# Patient Record
Sex: Male | Born: 1975 | Race: White | Hispanic: No | Marital: Married | Smoking: Current some day smoker
Health system: Southern US, Community
[De-identification: ages and names within clinical notes are randomized; demographics above are authoritative.]

## PROBLEM LIST (undated history)

## (undated) DIAGNOSIS — I10 Essential (primary) hypertension: Secondary | ICD-10-CM

## (undated) DIAGNOSIS — N200 Calculus of kidney: Secondary | ICD-10-CM

## (undated) DIAGNOSIS — Q613 Polycystic kidney, unspecified: Secondary | ICD-10-CM

## (undated) DIAGNOSIS — G8929 Other chronic pain: Secondary | ICD-10-CM

## (undated) DIAGNOSIS — N289 Disorder of kidney and ureter, unspecified: Secondary | ICD-10-CM

## (undated) DIAGNOSIS — M1990 Unspecified osteoarthritis, unspecified site: Secondary | ICD-10-CM

## (undated) HISTORY — DX: Other chronic pain: G89.29

## (undated) HISTORY — DX: Calculus of kidney: N20.0

## (undated) HISTORY — PX: LITHOTRIPSY: SUR834

---

## 1998-07-24 ENCOUNTER — Emergency Department (HOSPITAL_COMMUNITY): Admission: EM | Admit: 1998-07-24 | Discharge: 1998-07-24 | Payer: Self-pay | Admitting: *Deleted

## 1998-08-03 ENCOUNTER — Encounter: Admission: RE | Admit: 1998-08-03 | Discharge: 1998-08-03 | Payer: Self-pay | Admitting: *Deleted

## 2000-02-02 ENCOUNTER — Inpatient Hospital Stay (HOSPITAL_COMMUNITY): Admission: EM | Admit: 2000-02-02 | Discharge: 2000-02-09 | Payer: Self-pay | Admitting: Emergency Medicine

## 2001-04-18 ENCOUNTER — Emergency Department (HOSPITAL_COMMUNITY): Admission: EM | Admit: 2001-04-18 | Discharge: 2001-04-19 | Payer: Self-pay | Admitting: Emergency Medicine

## 2001-04-19 ENCOUNTER — Encounter: Payer: Self-pay | Admitting: Emergency Medicine

## 2001-04-19 IMAGING — CT CT PELVIS W/O CM
1 series · 15 of 32 positions shown, 19 images · non-contrast
Comparison: none

FINDINGS
CLINICAL DATA: LEFT FLANK PAIN.  HISTORY OF KIDNEY STONES.
CT ABDOMEN AND PELVIS WITHOUT CONTRAST
5 MM COLLIMATION NON-CONTRAST HELICAL CT IMAGES OF THE ABDOMEN AND PELVIS PERFORMED USING KIDNEY
STONE PROTOCOL.  NEITHER ORAL NOR INTRAVENOUS CONTRAST UTILIZED FOR THIS INDICATION.

[Series 2: renal stone · axial · 0.79mm/px · z∈[-511,-91]mm · 15 of 93 slices shown, 19 images]
[im 6/93  soft-tissue]
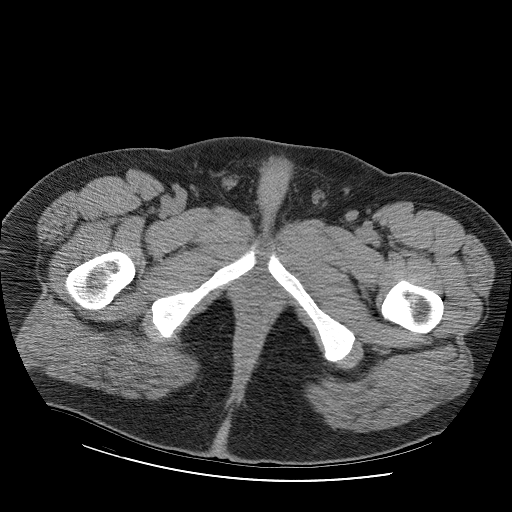
[im 6/93  bone]
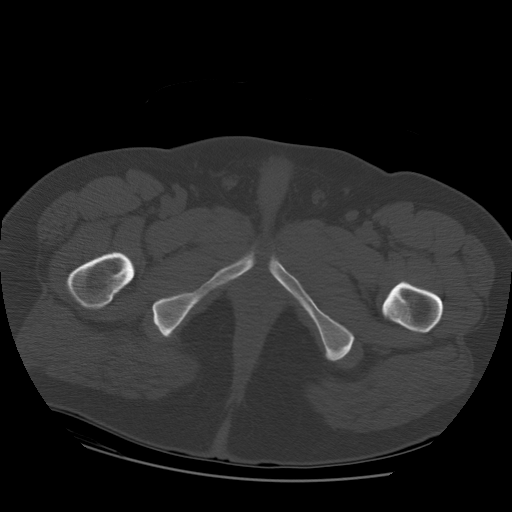
[im 12/93  soft-tissue]
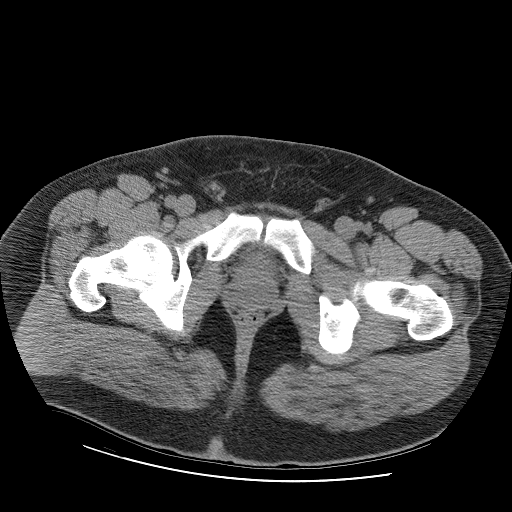
[im 18/93  soft-tissue]
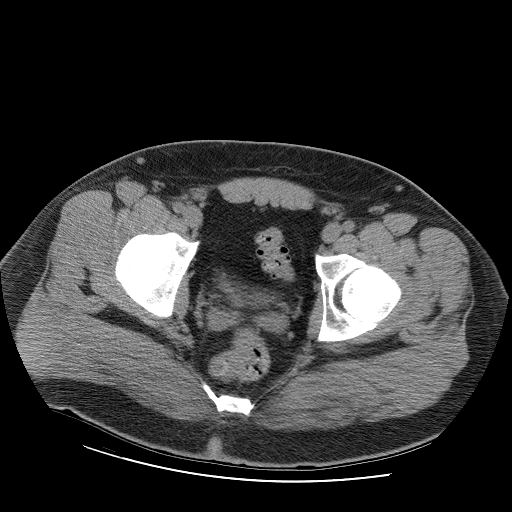
[im 27/93  soft-tissue]
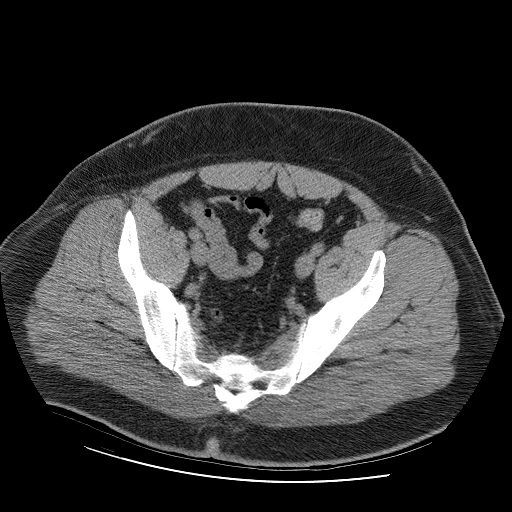
[im 33/93  soft-tissue]
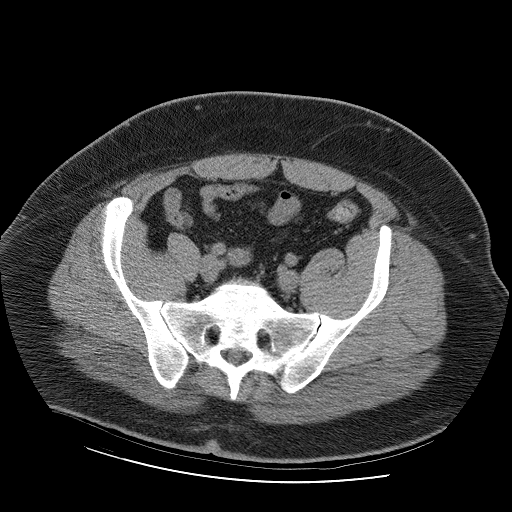
[im 39/93  soft-tissue]
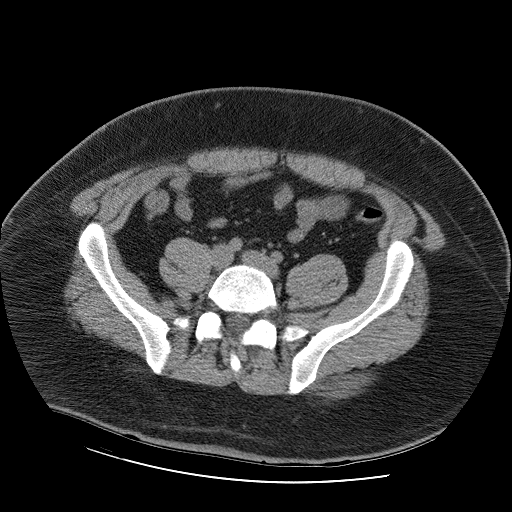
[im 48/93  soft-tissue]
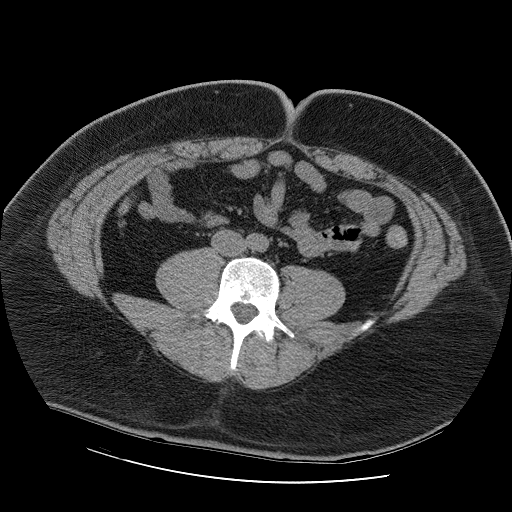
[im 54/93  soft-tissue]
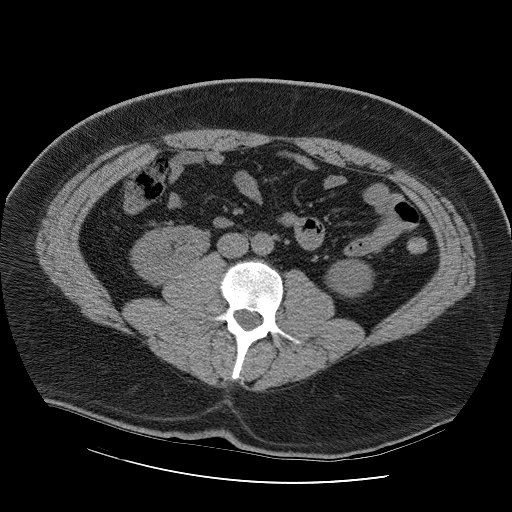
[im 60/93  soft-tissue]
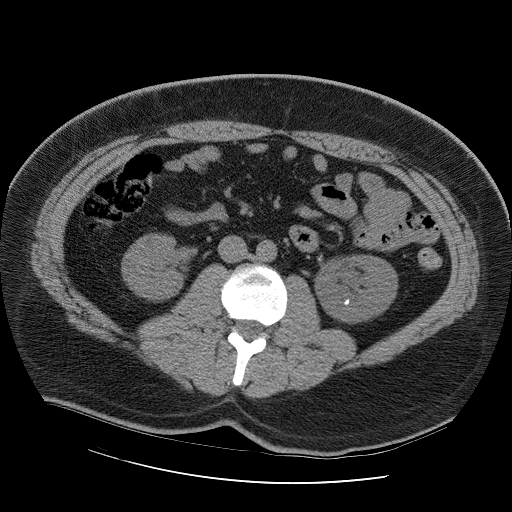
[im 60/93  bone]
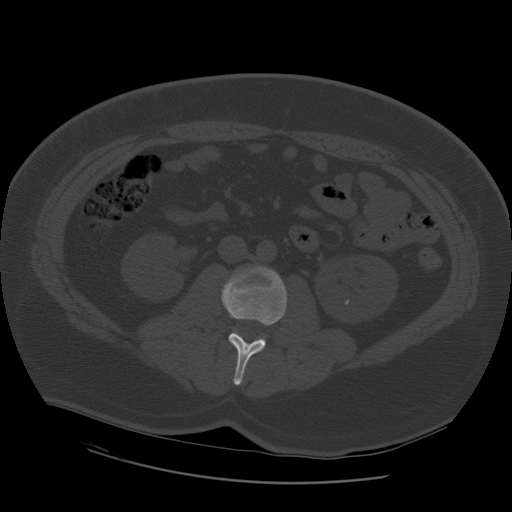
[im 66/93  soft-tissue]
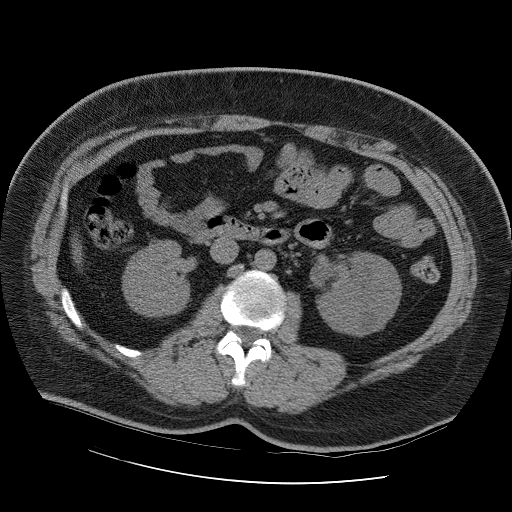
[im 75/93  soft-tissue]
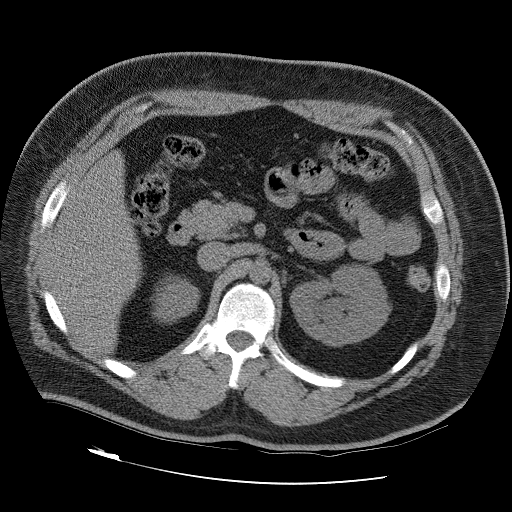
[im 81/93  soft-tissue]
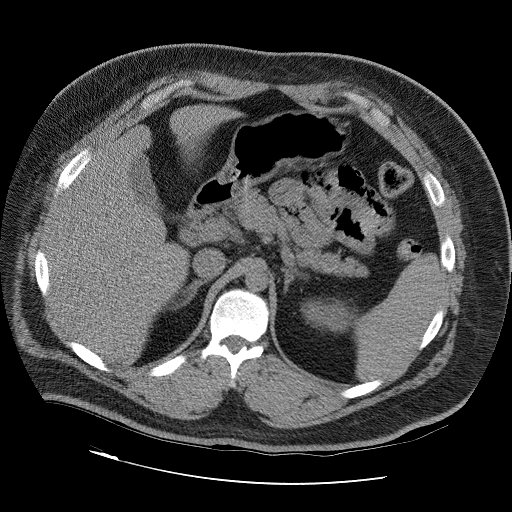
[im 81/93  lung]
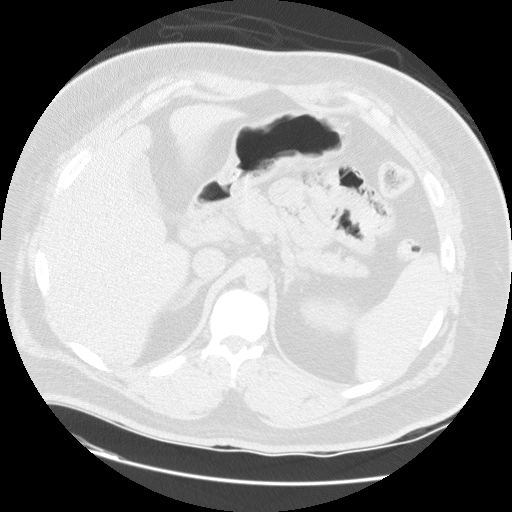
[im 84/93  lung]
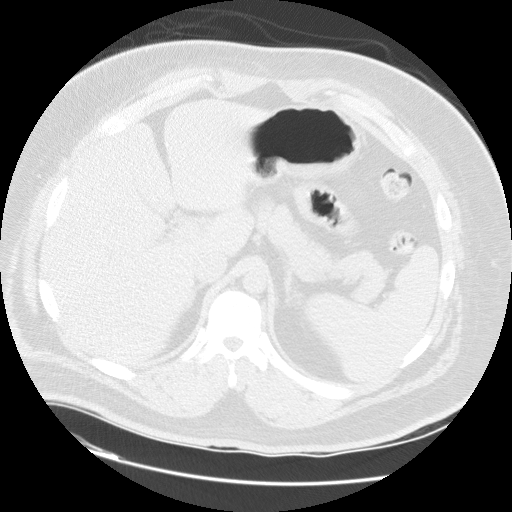
[im 87/93  soft-tissue]
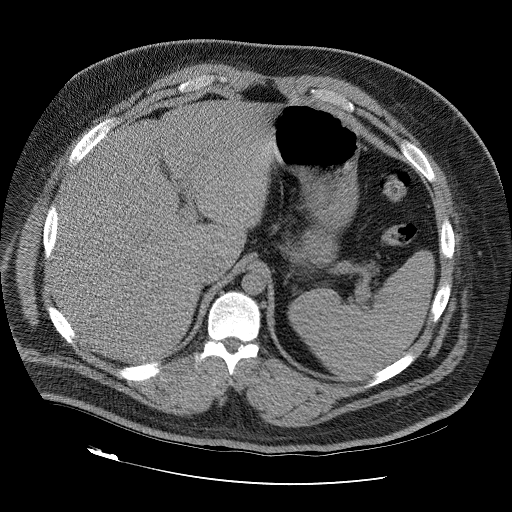
[im 87/93  lung]
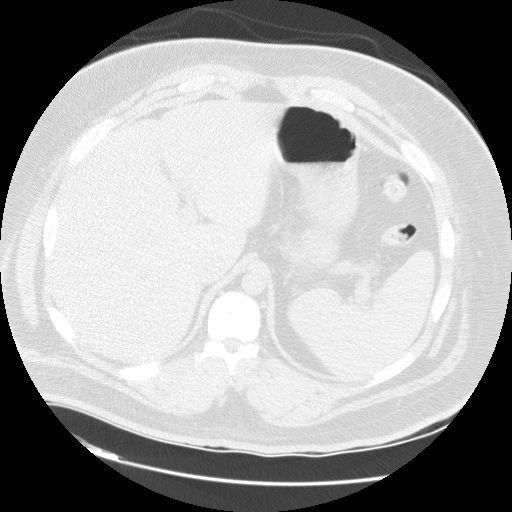
[im 90/93  lung]
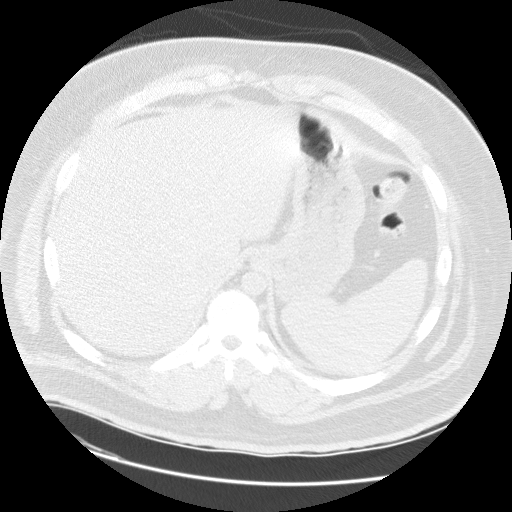

[15 of 32 positions shown; findings below may reference images not displayed]

FINDINGS: TWO CALCULI ARE SEEN IN THE LOWER POLE OF THE LEFT KIDNEY.  A TINY CALCULUS 2-3 MM DIAMETER IS SEEN
AT THE LEFT URETEROPELVIC JUNCTION WITH MILD LEFT HYDRONEPHROSIS.  NO URETERAL DILATATION.  LOW
ATTENUATION FOCI ARE SEEN IN BOTH KIDNEYS MEASURING UP TO 2.7 CM DIAMETER ON RIGHT AND 1.5 CM
DIAMETER ON LEFT, QUESTION CYSTS.  THESE COULD BE CONFIRMED BY FOLLOW-UP ULTRASOUND.  LEFT KIDNEY
APPEARS SLIGHTLY LARGER IN SIZE AS COMPARED TO THE RIGHT KIDNEY SUSPECT RELATED TO HYDRONEPHROSIS
AND OBSTRUCTION.  VISUALIZED SOLID ORGANS AND BOWEL LOOPS ARE OTHERWISE UNREMARKABLE FOR EXAM
LACKING IV AND ORAL CONTRAST.
IMPRESSION
LEFT RENAL CALCULI.  2-3 MM DIAMETER LEFT UPJ CALCULUS WITH MILD HYDRONEPHROSIS AND RENAL
ENLARGEMENT.
PROBABLE BILATERAL RENAL CYSTS, RECOMMEND FOLLOW-UP CONFIRMATION BY ULTRASOUND.
CT PELVIS
NO PELVIC MASS, ADENOPATHY OR FREE FLUID.  NO ADDITIONAL URETERAL CALCIFICATION OR URETERAL
DILATATION SEEN.
IMPRESSION
NEGATIVE CT PELVIS.

## 2003-09-07 ENCOUNTER — Emergency Department (HOSPITAL_COMMUNITY): Admission: EM | Admit: 2003-09-07 | Discharge: 2003-09-07 | Payer: Self-pay

## 2003-09-07 IMAGING — CT CT PELVIS W/O CM
1 of 2 series · 15 of 32 positions shown, 19 images · non-contrast
Comparison: none

CLINICAL DATA: Hematuria.  Left-sided abdominal pain.  History of kidney stones.
 CT SCAN OF THE ABDOMEN WITHOUT CONTRAST 
 There are two stones in the lower pole of the left kidney, the largest measuring approximately 7 mm in diameter.  There are multiple small lucent lesions in both lesions, most likely cysts, but this cannot be confirmed on unenhanced CT scan.  There is no hydronephrosis or evidence of ureteral stone. 
 The right kidney has no calculi, but multiple low density lesions, most likely cysts.  Does the patient have any evidence of infection?
 The visualized portions of the liver and spleen appear normal.  The pancreas and adrenal glands appear normal.  No dilated bowel.  The patient has a normal-appearing appendix.  
 IMPRESSION
 Two lower pole left renal calculi.  Multiple lucent lesions in both kidneys, most likely renal cysts, although this is not definitive.
 CT SCAN OF THE PELVIS WITHOUT CONTRAST 
 There is no evidence of adenopathy, free fluid, or other significant abnormality.  The distal ureters are not dilated. 
 Negative exam.

[Series 2: renal stone · axial · 0.84mm/px · z∈[-536,-140]mm · 15 of 90 slices shown, 19 images]
[im 7/90  soft-tissue]
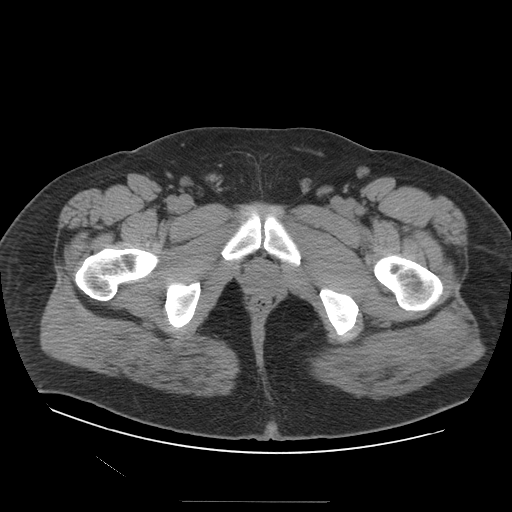
[im 7/90  bone]
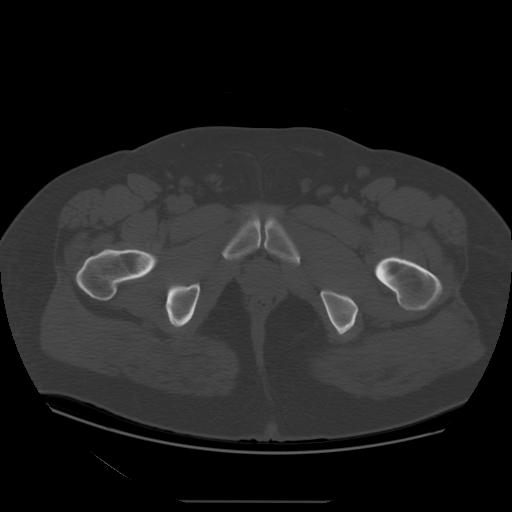
[im 13/90  soft-tissue]
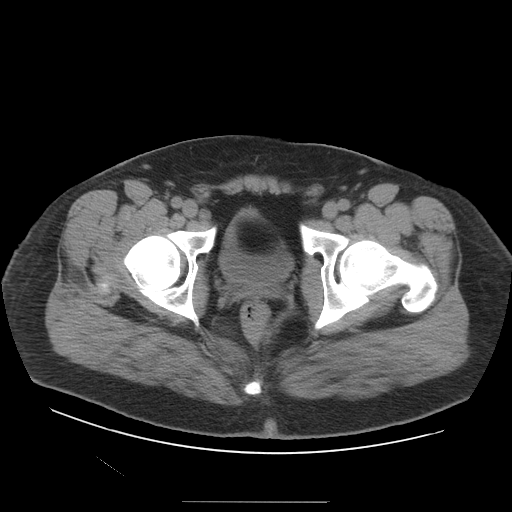
[im 19/90  soft-tissue]
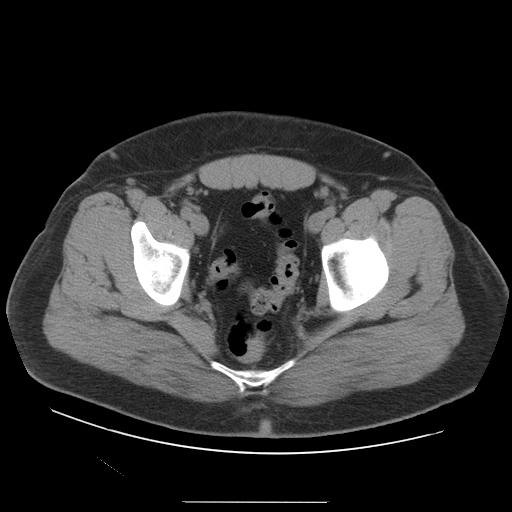
[im 25/90  soft-tissue]
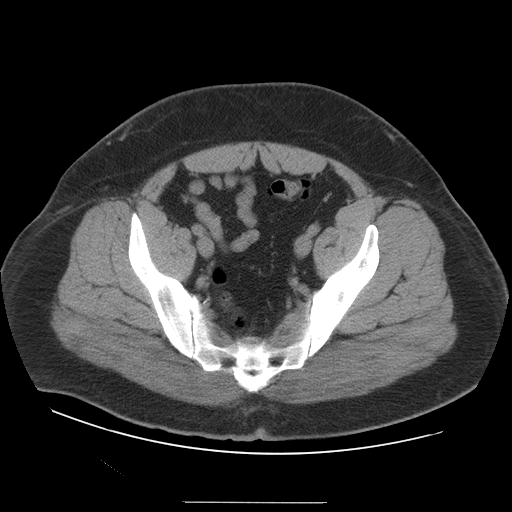
[im 31/90  soft-tissue]
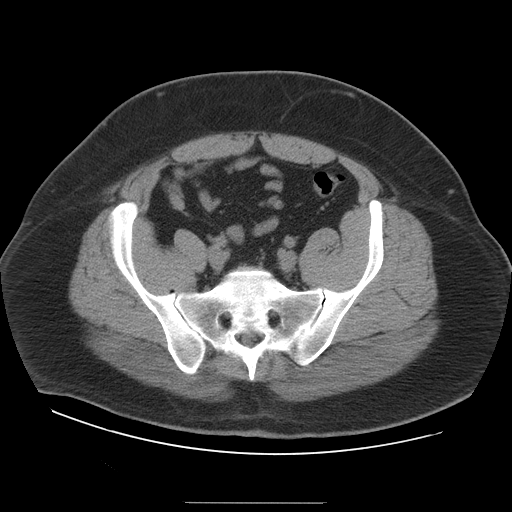
[im 37/90  soft-tissue]
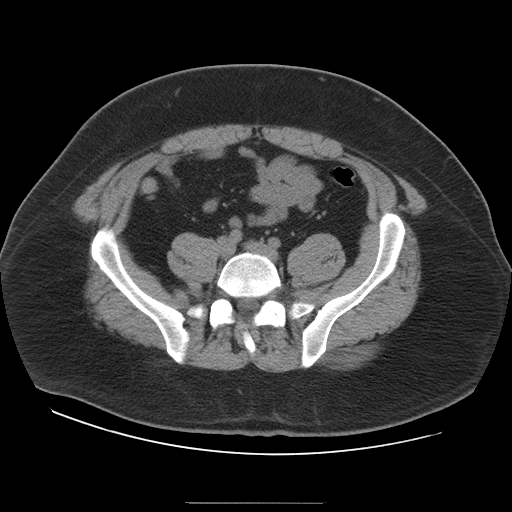
[im 47/90  soft-tissue]
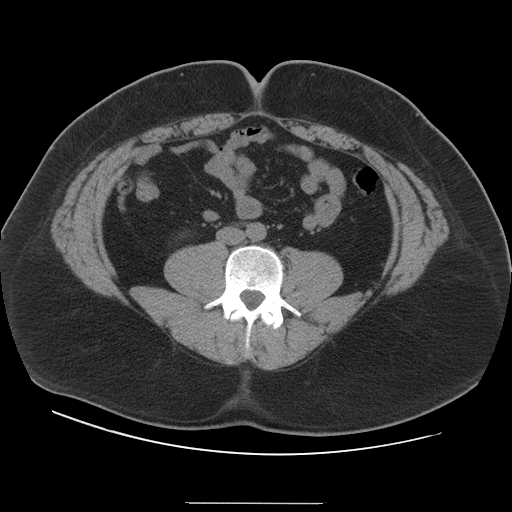
[im 53/90  soft-tissue]
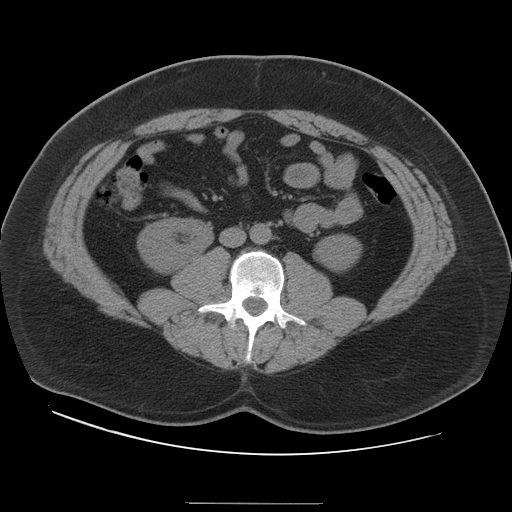
[im 59/90  soft-tissue]
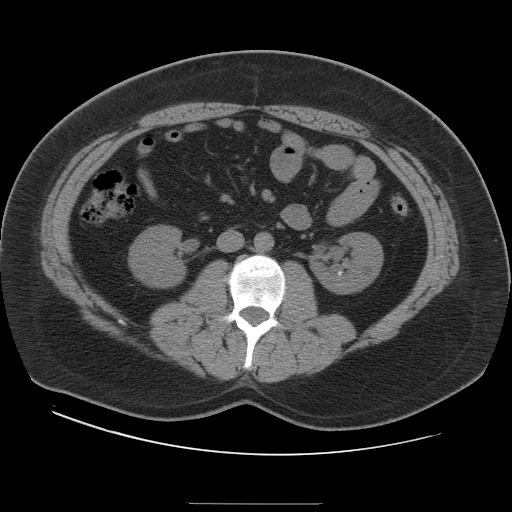
[im 59/90  bone]
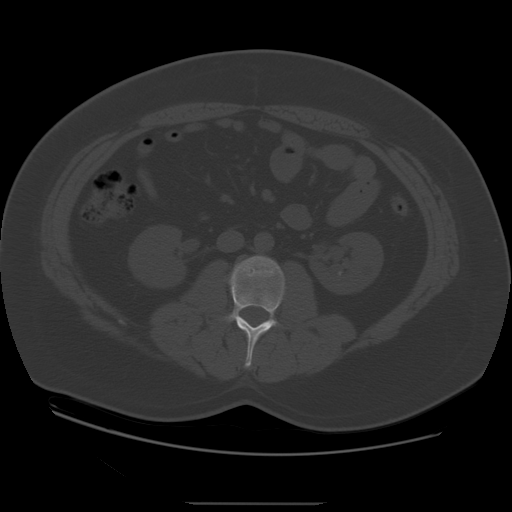
[im 65/90  soft-tissue]
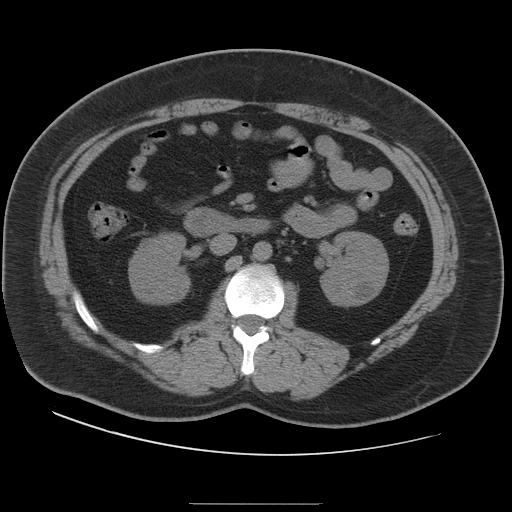
[im 71/90  soft-tissue]
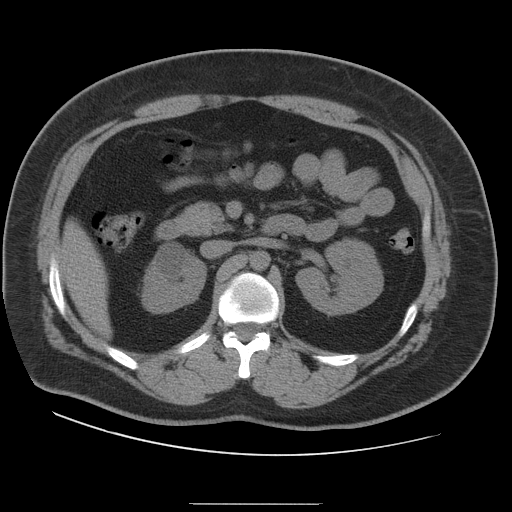
[im 77/90  soft-tissue]
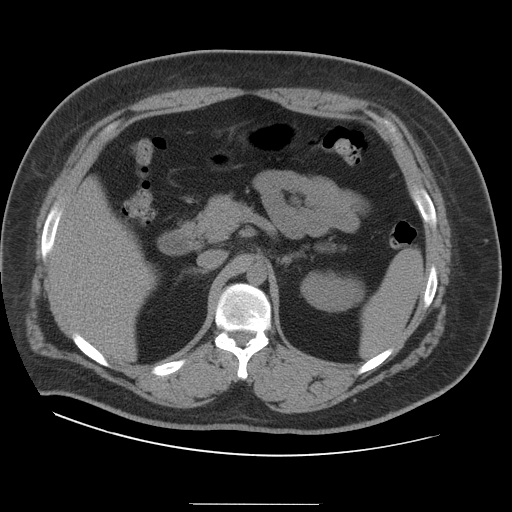
[im 77/90  lung]
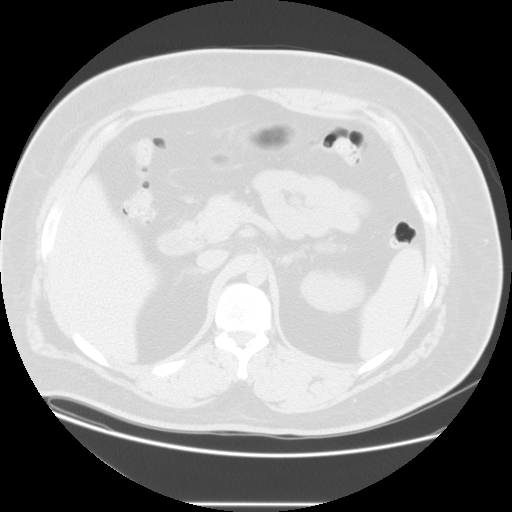
[im 80/90  lung]
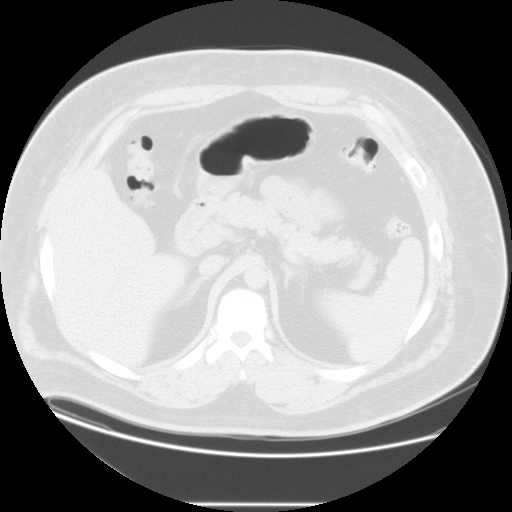
[im 83/90  soft-tissue]
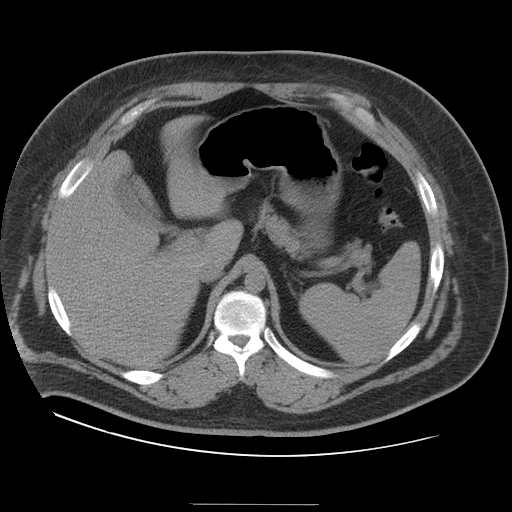
[im 83/90  lung]
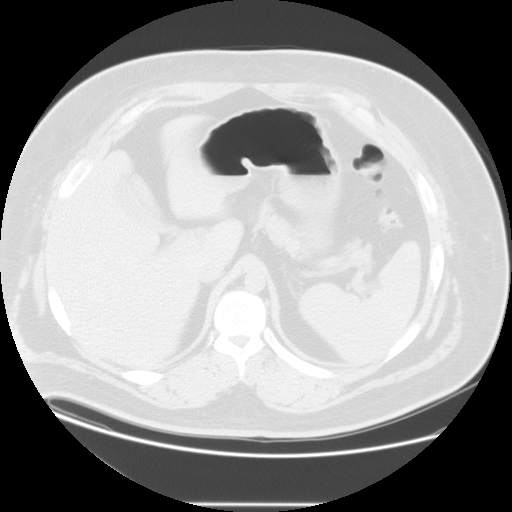
[im 86/90  lung]
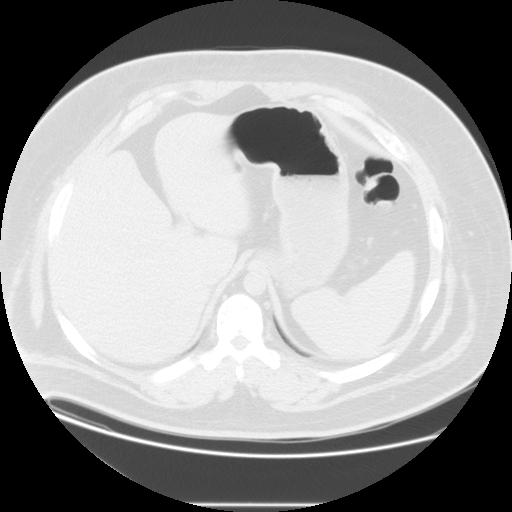

[15 of 32 positions shown; findings below may reference images not displayed]

## 2003-11-01 ENCOUNTER — Emergency Department (HOSPITAL_COMMUNITY): Admission: EM | Admit: 2003-11-01 | Discharge: 2003-11-01 | Payer: Self-pay | Admitting: Emergency Medicine

## 2003-11-01 IMAGING — CR DG HAND COMPLETE 3+V*R*
3 series · 3 of 3 positions shown · non-contrast
Comparison: none

CLINICAL DATA: Punched wall yesterday.  Pain in the fifth metacarpal.
RIGHT HAND COMPLETE
Three views of the right hand reveal a fracture of the distal fifth metacarpal with angulation towards the ulnar and dorsal side.   No other fractures are noted.  There is some soft tissue swelling adjacent to the fracture area.

[view not recorded (1 of 3)]
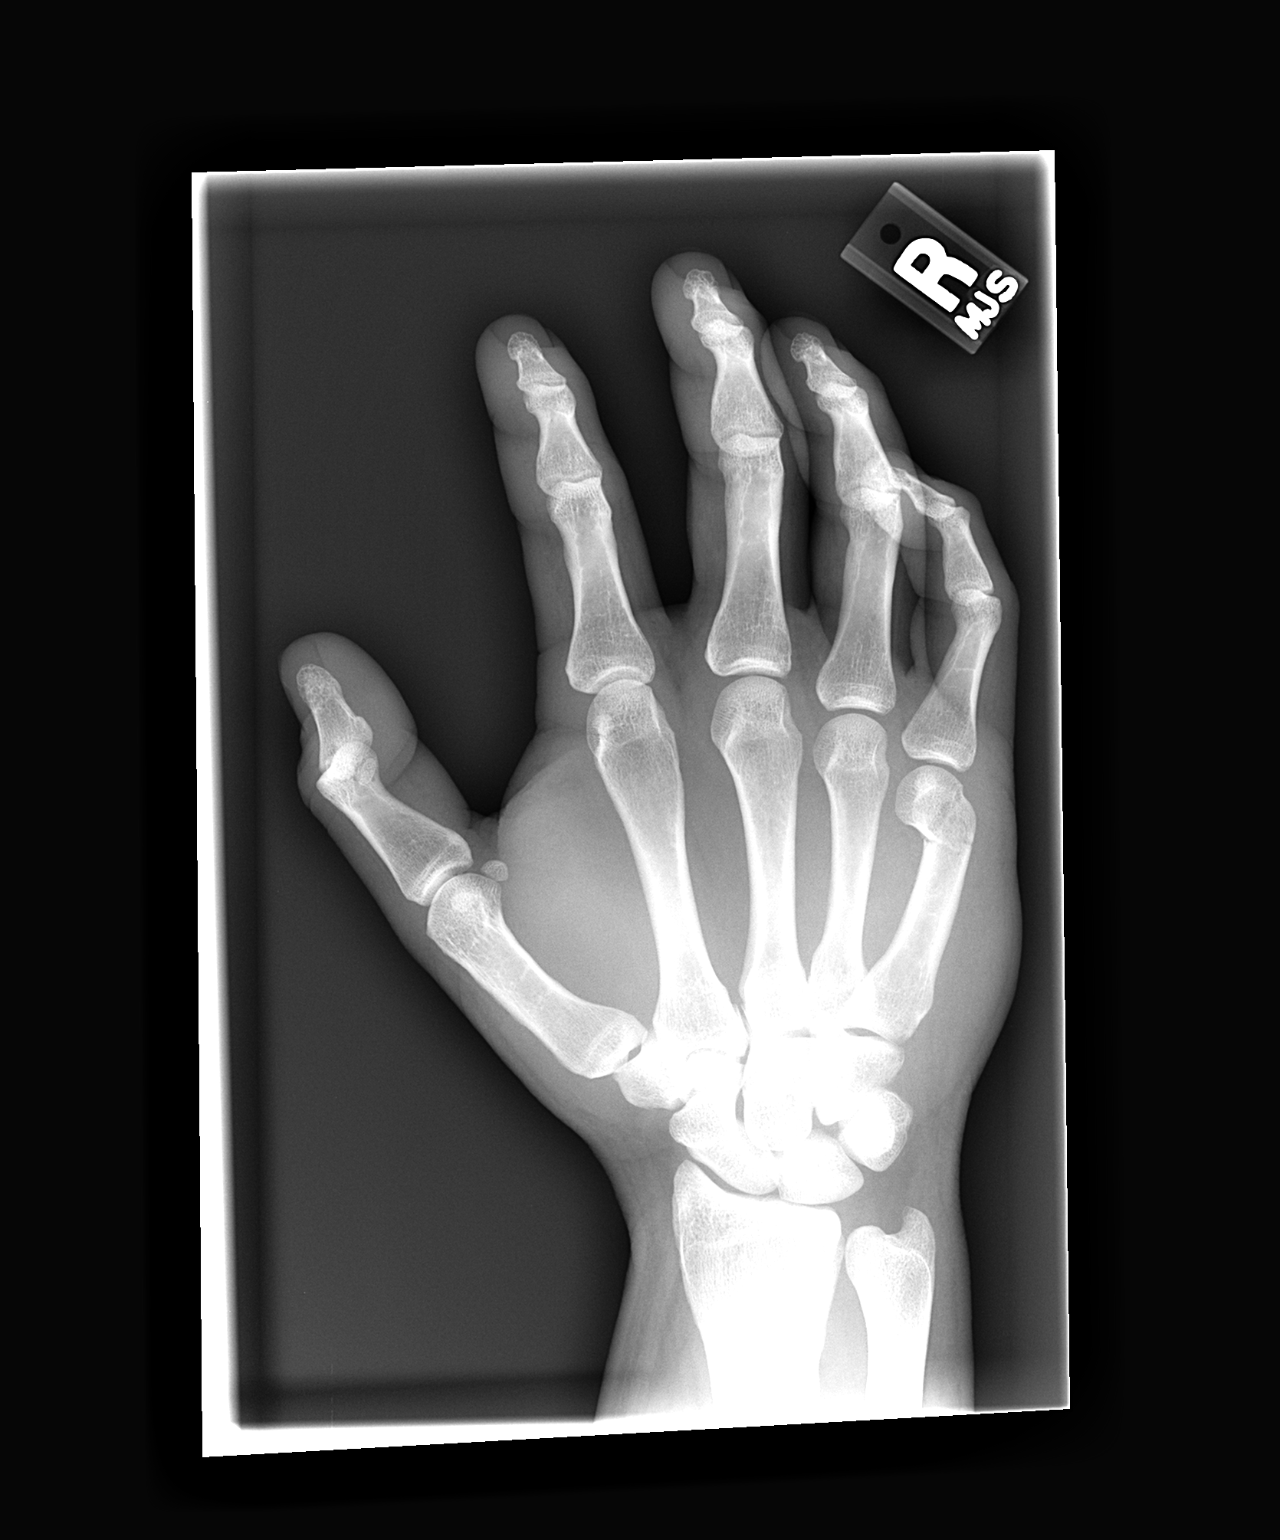

[view not recorded (2 of 3)]
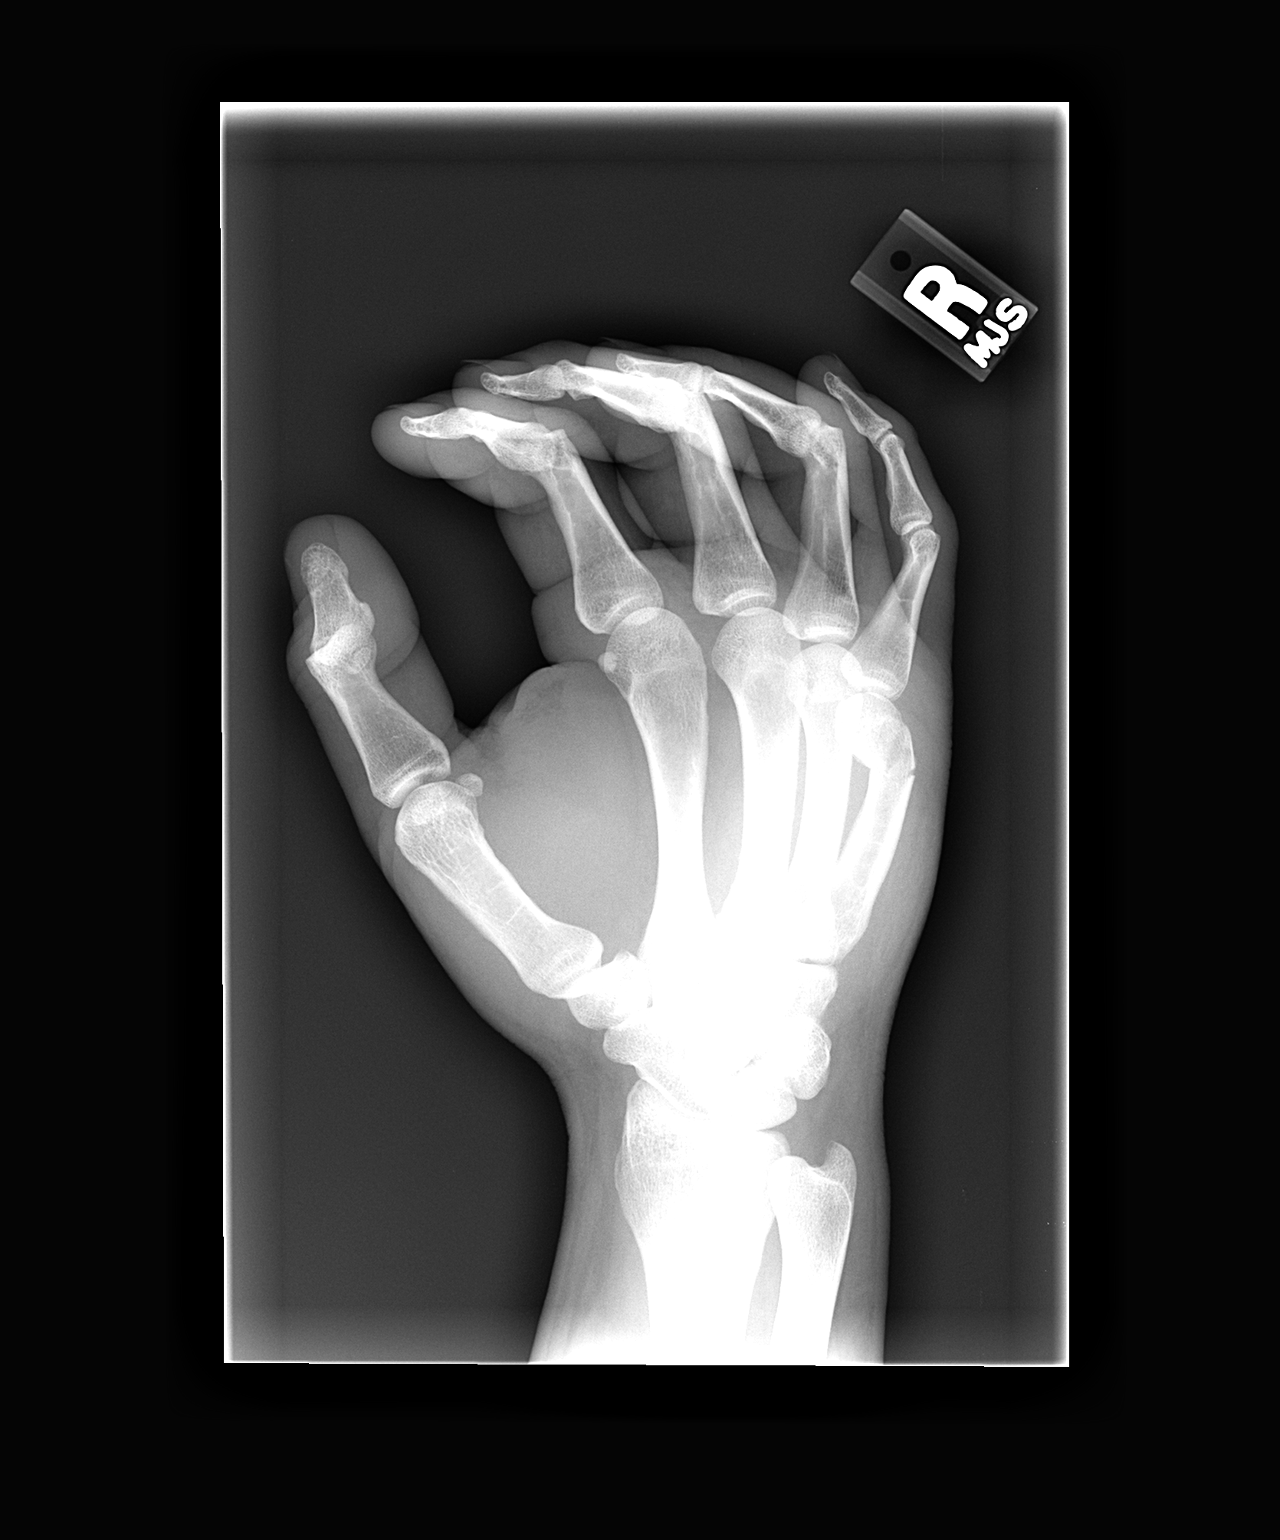

[view not recorded (3 of 3)]
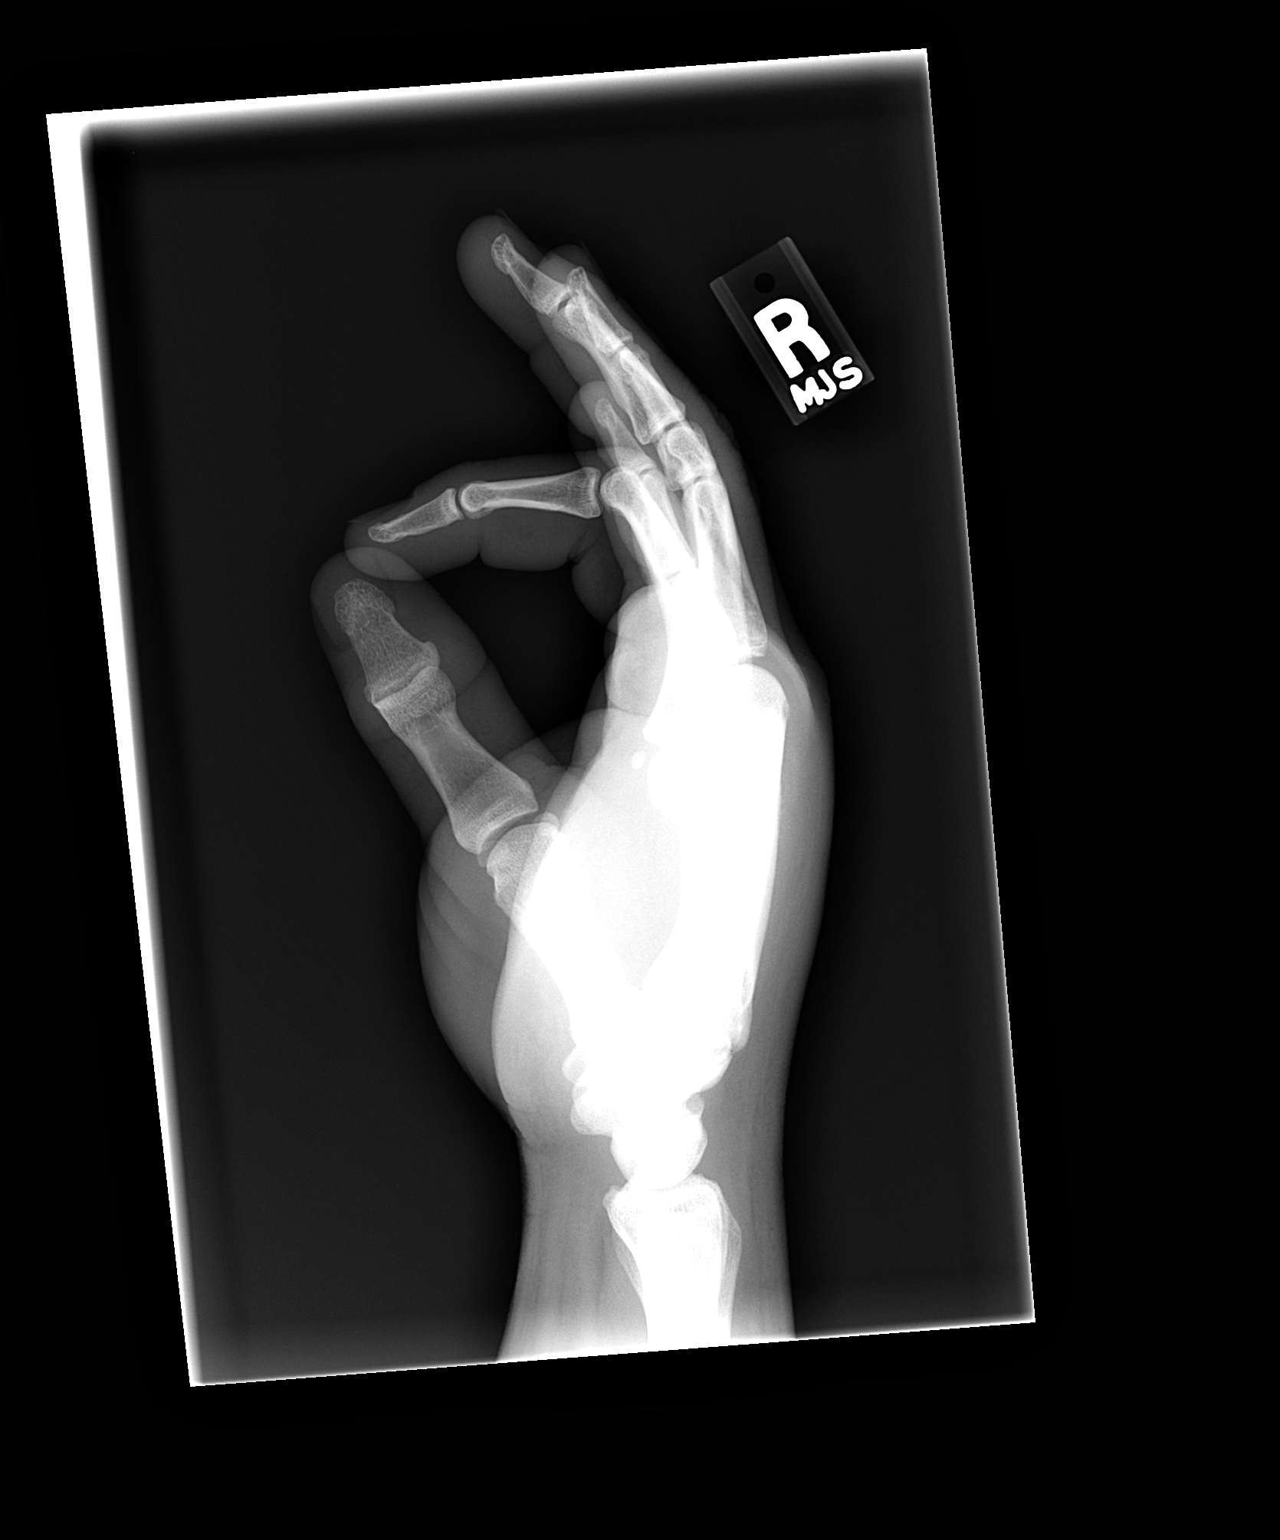

[3 of 3 positions shown; findings below may reference images not displayed]

IMPRESSION: Fracture of the fifth metacarpal of the right hand with other discussion as above.

## 2004-03-04 ENCOUNTER — Emergency Department (HOSPITAL_COMMUNITY): Admission: EM | Admit: 2004-03-04 | Discharge: 2004-03-04 | Payer: Self-pay | Admitting: Family Medicine

## 2016-10-06 ENCOUNTER — Encounter (HOSPITAL_COMMUNITY): Payer: Self-pay | Admitting: Emergency Medicine

## 2016-10-06 ENCOUNTER — Emergency Department (HOSPITAL_COMMUNITY)

## 2016-10-06 ENCOUNTER — Emergency Department (HOSPITAL_COMMUNITY)
Admission: EM | Admit: 2016-10-06 | Discharge: 2016-10-06 | Disposition: A | Discharge status: Home / Self Care | Attending: Emergency Medicine | Admitting: Emergency Medicine

## 2016-10-06 DIAGNOSIS — R0789 Other chest pain: Secondary | ICD-10-CM | POA: Insufficient documentation

## 2016-10-06 DIAGNOSIS — I10 Essential (primary) hypertension: Secondary | ICD-10-CM | POA: Insufficient documentation

## 2016-10-06 DIAGNOSIS — Z87891 Personal history of nicotine dependence: Secondary | ICD-10-CM | POA: Insufficient documentation

## 2016-10-06 HISTORY — DX: Essential (primary) hypertension: I10

## 2016-10-06 HISTORY — DX: Unspecified osteoarthritis, unspecified site: M19.90

## 2016-10-06 HISTORY — DX: Polycystic kidney, unspecified: Q61.3

## 2016-10-06 HISTORY — DX: Disorder of kidney and ureter, unspecified: N28.9

## 2016-10-06 LAB — BASIC METABOLIC PANEL
ANION GAP: 7 (ref 5–15)
BUN: 13 mg/dL (ref 6–20)
CALCIUM: 9 mg/dL (ref 8.9–10.3)
CO2: 27 mmol/L (ref 22–32)
Chloride: 105 mmol/L (ref 101–111)
Creatinine, Ser: 1.01 mg/dL (ref 0.61–1.24)
GLUCOSE: 121 mg/dL — AB (ref 65–99)
POTASSIUM: 3.5 mmol/L (ref 3.5–5.1)
Sodium: 139 mmol/L (ref 135–145)

## 2016-10-06 LAB — CBC
HEMATOCRIT: 41.8 % (ref 39.0–52.0)
HEMOGLOBIN: 14.6 g/dL (ref 13.0–17.0)
MCH: 30.2 pg (ref 26.0–34.0)
MCHC: 34.9 g/dL (ref 30.0–36.0)
MCV: 86.4 fL (ref 78.0–100.0)
Platelets: 155 10*3/uL (ref 150–400)
RBC: 4.84 MIL/uL (ref 4.22–5.81)
RDW: 12.9 % (ref 11.5–15.5)
WBC: 5.5 10*3/uL (ref 4.0–10.5)

## 2016-10-06 LAB — TROPONIN I

## 2016-10-06 IMAGING — DX DG CHEST 2V
2 series · 2 of 2 positions shown · non-contrast
Comparison: [DATE]

CLINICAL DATA: Chest pain and shortness of breath.

EXAM:
CHEST  2 VIEW

[chest pa]
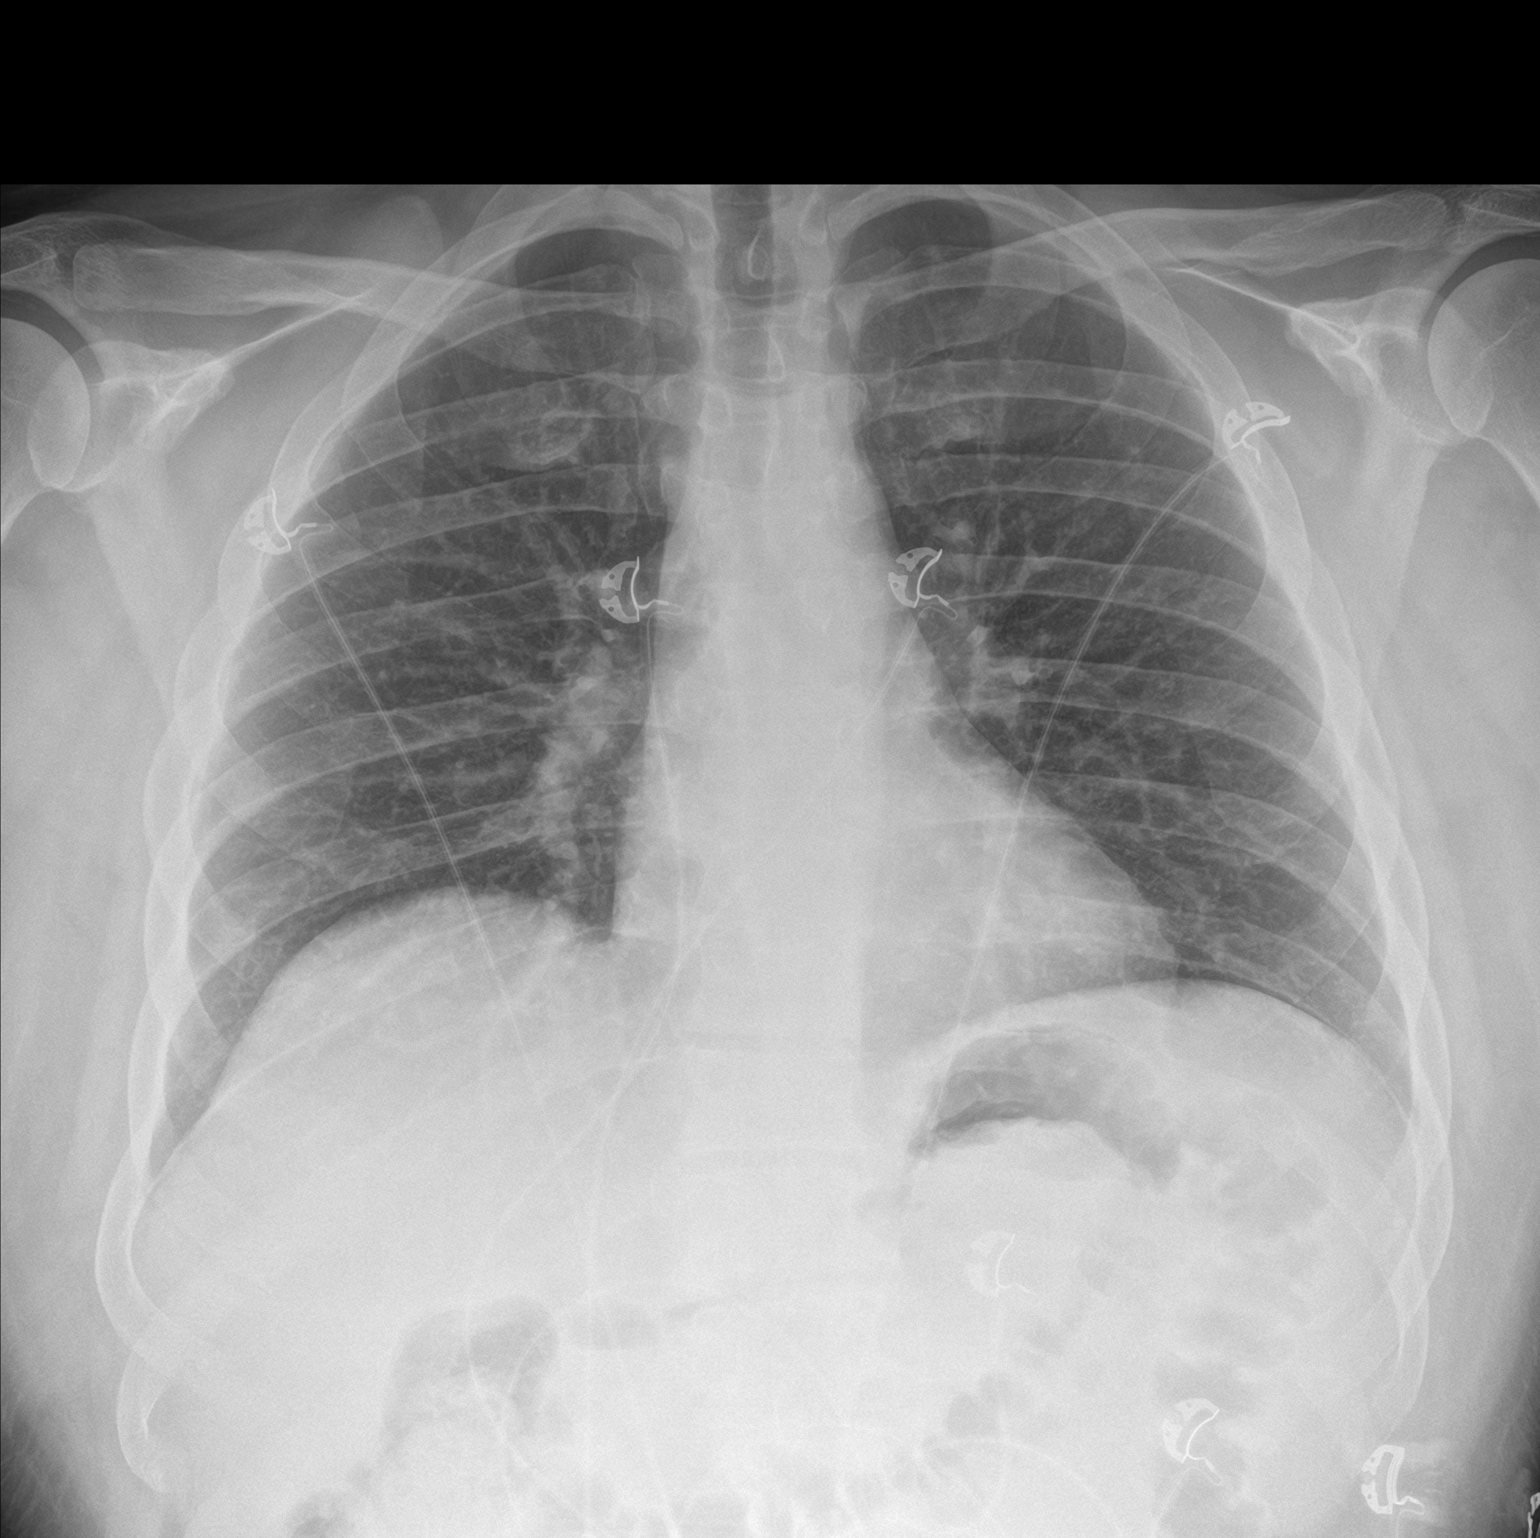

[chest lat]
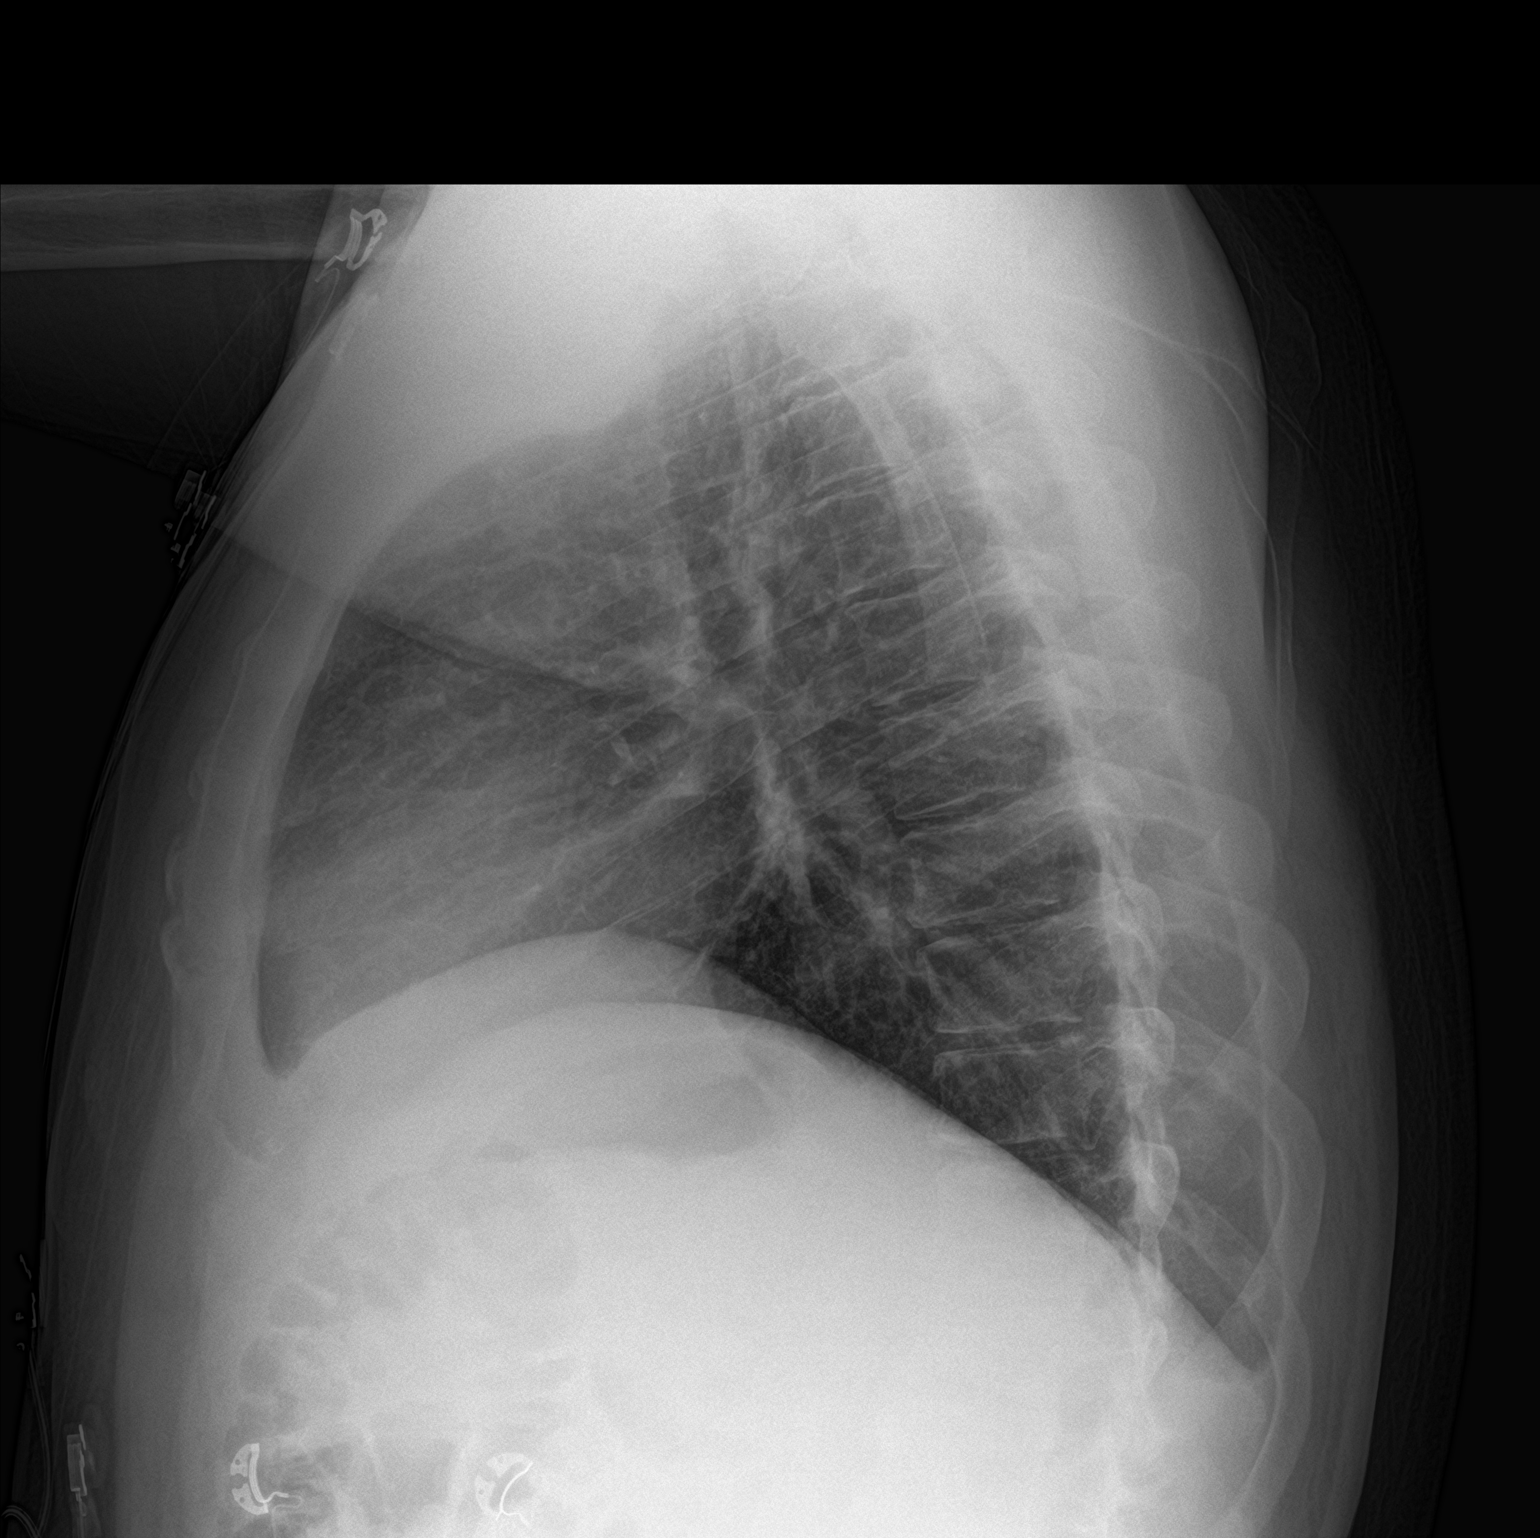

[2 of 2 positions shown; findings below may reference images not displayed]

FINDINGS: Lungs are adequately inflated without consolidation or effusion.
Cardiomediastinal silhouette, bones and soft tissues are within
normal.
IMPRESSION: No active cardiopulmonary disease.

## 2016-10-06 MED ORDER — ACETAMINOPHEN 325 MG PO TABS
650.0000 mg | ORAL_TABLET | Freq: Once | ORAL | Status: AC
  Administered 2016-10-06: 650 mg via ORAL
  Filled 2016-10-06: qty 2

## 2016-10-06 NOTE — ED Notes (Signed)
EDP Madilyn Hook made aware of pt's heart rates. She reviewed pt's EKG. Pt asymptomatic. Pt ok to be discharged.

## 2016-10-06 NOTE — ED Notes (Addendum)
Gave report to triage RN Adewale

## 2016-10-06 NOTE — Discharge Instructions (Signed)
Take 1 baby aspirin a day and follow-up with your provider in 1-2 weeks.

## 2016-10-06 NOTE — ED Triage Notes (Signed)
Pt brought in EMS after sudden onset chest pain that lasted 10 minutes. Pt reports felt weak and was pale at this time. Pt denies chest pain at arrival. Pt reports headache at this time. nad noted. Pt reports history of same and reports was related to stress.

## 2016-10-06 NOTE — ED Provider Notes (Signed)
[facility] Provider Note   CSN: [id number] Arrival date & time: 10/06/16  1833     History   Chief Complaint Chief Complaint  Patient presents with  . Chest Pain    HPI Brent Horn is a 41 y.o. male.  Patient states that he had chest pain shortness of breath for about 10-15 minutes. He feels fine now   The history is provided by the patient.  Chest Pain   This is a new problem. The current episode started less than 1 hour ago. The problem occurs rarely. The problem has been resolved. Associated with: laying down. The pain is present in the substernal region. The pain is at a severity of 2/10. The pain is mild. The quality of the pain is described as dull. The pain does not radiate. Pertinent negatives include no abdominal pain, no back pain, no cough, no headaches and no malaise/fatigue.  Pertinent negatives for past medical history include no seizures.    Past Medical History:  Diagnosis Date  . Arthritis   . Hypertension   . Polycystic kidney disease   . Renal disorder     There are no active problems to display for this patient.   Past Surgical History:  Procedure Laterality Date  . LITHOTRIPSY         Home Medications    Prior to Admission medications   Not on File    Family History History reviewed. No pertinent family history.  Social History Social History  Substance Use Topics  . Smoking status: Former Games developer  . Smokeless tobacco: Former Neurosurgeon  . Alcohol use No     Allergies   Patient has no allergy information on record.   Review of Systems Review of Systems  Constitutional: Negative for appetite change, fatigue and malaise/fatigue.  HENT: Negative for congestion, ear discharge and sinus pressure.   Eyes: Negative for discharge.  Respiratory: Negative for cough.   Cardiovascular: Positive for chest pain.  Gastrointestinal: Negative for abdominal pain and diarrhea.  Genitourinary: Negative for frequency and  hematuria.  Musculoskeletal: Negative for back pain.  Skin: Negative for rash.  Neurological: Negative for seizures and headaches.  Psychiatric/Behavioral: Negative for hallucinations.     Physical Exam Updated Vital Signs BP (!) 118/59   Pulse (!) 47   Temp 98.2 F (36.8 C) (Oral)   Resp (!) 21   Ht  (1.803 m)   Wt 102.1 kg (225 lb)   SpO2 96%   BMI 31.38 kg/m   Physical Exam  Constitutional: He is oriented to person, place, and time. He appears well-developed.  HENT:  Head: Normocephalic.  Eyes: Conjunctivae and EOM are normal. No scleral icterus.  Neck: Neck supple. No thyromegaly present.  Cardiovascular: Normal rate and regular rhythm.  Exam reveals no gallop and no friction rub.   No murmur heard. Pulmonary/Chest: No stridor. He has no wheezes. He has no rales. He exhibits no tenderness.  Abdominal: He exhibits no distension. There is no tenderness. There is no rebound.  Musculoskeletal: Normal range of motion. He exhibits no edema.  Lymphadenopathy:    He has no cervical adenopathy.  Neurological: He is oriented to person, place, and time. He exhibits normal muscle tone. Coordination normal.  Skin: No rash noted. No erythema.  Psychiatric: He has a normal mood and affect. His behavior is normal.     ED Treatments / Results  Labs (all labs ordered are listed, but only abnormal results are displayed) Labs Reviewed  BASIC METABOLIC  PANEL - Abnormal; Notable for the following:       Result Value   Glucose, Bld 121 (*)    All other components within normal limits  CBC  TROPONIN I  TROPONIN I    EKG  EKG Interpretation  Date/Time:  Saturday October 06 2016 18:45:10 EDT Ventricular Rate:  59 PR Interval:    QRS Duration: 105 QT Interval:  408 QTC Calculation: 405 R Axis:   51 Text Interpretation:  Sinus rhythm Low voltage, precordial leads Confirmed by Bethann Berkshire [phone]) on 10/06/2016 7:08:00 PM       Radiology Dg Chest 2 View  Result  Date: 10/06/2016 CLINICAL DATA:  Chest pain and shortness of breath. EXAM: CHEST  2 VIEW COMPARISON:  11/03/2014 FINDINGS: Lungs are adequately inflated without consolidation or effusion. Cardiomediastinal silhouette, bones and soft tissues are within normal. IMPRESSION: No active cardiopulmonary disease. Electronically Signed   By: Elberta Fortis M.D.   On: 10/06/2016 19:16    Procedures Procedures (including critical care time)  Medications Ordered in ED Medications  acetaminophen (TYLENOL) tablet 650 mg (650 mg Oral Given 10/06/16 2149)     Initial Impression / Assessment and Plan / ED Course  I have reviewed the triage vital signs and the nursing notes.  Pertinent labs & imaging results that were available during my care of the patient were reviewed by me and considered in my medical decision making (see chart for details).       Final Clinical Impressions(s) / ED Diagnoses   Final diagnoses:  None   Start taking 1 baby aspirin a day and follow-up with your provider this week for recheck New Prescriptions New Prescriptions   No medications on file     Bethann Berkshire, MD 10/06/16 2156

## 2019-12-03 ENCOUNTER — Ambulatory Visit (HOSPITAL_COMMUNITY)
Admission: EM | Admit: 2019-12-03 | Discharge: 2019-12-03 | Disposition: A | Discharge status: Home / Self Care | Payer: Medicaid Other | Attending: Emergency Medicine | Admitting: Emergency Medicine

## 2019-12-03 ENCOUNTER — Encounter (HOSPITAL_COMMUNITY): Payer: Self-pay

## 2019-12-03 ENCOUNTER — Other Ambulatory Visit: Payer: Self-pay

## 2019-12-03 DIAGNOSIS — G8929 Other chronic pain: Secondary | ICD-10-CM

## 2019-12-03 DIAGNOSIS — M5441 Lumbago with sciatica, right side: Secondary | ICD-10-CM | POA: Diagnosis not present

## 2019-12-03 MED ORDER — PREDNISONE 10 MG PO TABS: ORAL_TABLET | ORAL | 0 refills | Status: DC

## 2019-12-03 MED ORDER — CYCLOBENZAPRINE HCL 5 MG PO TABS: 5.0000 mg | ORAL_TABLET | Freq: Two times a day (BID) | ORAL | 0 refills | Status: DC | PRN

## 2019-12-03 MED ORDER — HYDROCODONE-ACETAMINOPHEN 5-325 MG PO TABS: 1.0000 | ORAL_TABLET | Freq: Four times a day (QID) | ORAL | 0 refills | Status: DC | PRN

## 2019-12-03 NOTE — ED Provider Notes (Signed)
[facility]    CSN: [id number] Arrival date & time: 12/03/19  6010      History   Chief Complaint Chief Complaint  Patient presents with   Leg Pain   Back Pain    HPI Brent Horn is a 44 y.o. male history of polycystic kidney disease, hypertension, chronic back pain patient today for evaluation of back pain.  Reports over the past 2 months he has had increased pain of his chronic back pain.  Denies any new injury or trauma or inciting incident.  Denies any increase in activity or heavy lifting.  Pain is more prominent on the right side and radiates into her right leg pain.  Occasionally will have numbness sensation.  Reports that he was told previously that he needed back surgery, but has never proceeded with this.  Has been using NSAIDs without relief as well as expresses concerns with using these in relation to his kidneys.  Denies history of diabetes. HPI  Past Medical History:  Diagnosis Date   Arthritis    Hypertension    Polycystic kidney disease    Renal disorder     There are no problems to display for this patient.   Past Surgical History:  Procedure Laterality Date   LITHOTRIPSY         Home Medications    Prior to Admission medications   Medication Sig Start Date End Date Taking? Authorizing Provider  cyclobenzaprine (FLEXERIL) 5 MG tablet Take 1-2 tablets (5-10 mg total) by mouth 2 (two) times daily as needed for muscle spasms. 12/03/19   Brent Lawhorne C, PA-Horn  HYDROcodone-acetaminophen (NORCO/VICODIN) 5-325 MG tablet Take 1-2 tablets by mouth every 6 (six) hours as needed for severe pain. 12/03/19   Brent Meloy C, PA-Horn  predniSONE (DELTASONE) 10 MG tablet Begin with 6 tabs on day 1, 5 tab on day 2, 4 tab on day 3, 3 tab on day 4, 2 tab on day 5, 1 tab on day 6-take with food 12/03/19   Brent Horn, Junius Creamer, PA-Horn    Family History History reviewed. No pertinent family history.  Social History Social History   Tobacco Use     Smoking status: Former Smoker   Smokeless tobacco: Former Forensic psychologist Use: Never used  Substance Use Topics   Alcohol use: No   Drug use: No     Allergies   Patient has no known allergies.   Review of Systems Review of Systems  Constitutional: Negative for fatigue and fever.  Eyes: Negative for redness, itching and visual disturbance.  Respiratory: Negative for shortness of breath.   Cardiovascular: Negative for chest pain and leg swelling.  Gastrointestinal: Negative for nausea and vomiting.  Genitourinary: Negative for decreased urine volume, difficulty urinating and hematuria.  Musculoskeletal: Positive for back pain and myalgias. Negative for arthralgias.  Skin: Negative for color change, rash and wound.  Neurological: Negative for dizziness, syncope, weakness, light-headedness and headaches.     Physical Exam Triage Vital Signs ED Triage Vitals  Enc Vitals Group     BP      Pulse      Resp      Temp      Temp src      SpO2      Weight      Height      Head Circumference      Peak Flow      Pain Score      Pain Loc  Pain Edu?      Excl. in GC?    No data found.  Updated Vital Signs BP (!) 153/95 (BP Location: Left Arm)    Pulse 72    Temp 98 F (36.7 Horn) (Oral)    Resp 20    SpO2 96%   Visual Acuity Right Eye Distance:   Left Eye Distance:   Bilateral Distance:    Right Eye Near:   Left Eye Near:    Bilateral Near:     Physical Exam Vitals and nursing note reviewed.  Constitutional:      Appearance: He is well-developed.     Comments: No acute distress  HENT:     Head: Normocephalic and atraumatic.     Nose: Nose normal.  Eyes:     Conjunctiva/sclera: Conjunctivae normal.  Cardiovascular:     Rate and Rhythm: Normal rate.  Pulmonary:     Effort: Pulmonary effort is normal. No respiratory distress.  Abdominal:     General: There is no distension.  Musculoskeletal:        General: Normal range of motion.      Cervical back: Neck supple.     Comments: Back: Nontender to palpation of lumbar spine midline, increased tenderness throughout right lumbar musculature, strength at hips knees 5/5 ankle bilaterally, patellar reflex 1+ bilaterally  Skin:    General: Skin is warm and dry.  Neurological:     Mental Status: He is alert and oriented to person, place, and time.      UC Treatments / Results  Labs (all labs ordered are listed, but only abnormal results are displayed) Labs Reviewed - No data to display  EKG   Radiology No results found.  Procedures Procedures (including critical care time)  Medications Ordered in UC Medications - No data to display  Initial Impression / Assessment and Plan / UC Course  I have reviewed the triage vital signs and the nursing notes.  Pertinent labs & imaging results that were available during my care of the patient were reviewed by me and considered in my medical decision making (see chart for details).     Acute on chronic back pain with no new mechanism of injury.  Has been using NSAIDs without relief, will switch to 6-day taper of prednisone along with muscle relaxers.  Provided 2 days worth of hydrocodone to use for severe pain/nighttime pain.  Follow-up with orthopedics for further evaluation and management of pain given chronic in nature.  Also provided contact for PCP.  No red flags for cauda equina.  Discussed strict return precautions. Patient verbalized understanding and is agreeable with plan.  Final Clinical Impressions(s) / UC Diagnoses   Final diagnoses:  Chronic bilateral low back pain with right-sided sciatica     Discharge Instructions     Begin prednisone taper over the next 6 days-begin with 6 tablets on day 1, decrease by 1 tablet each day until complete-6, 5, 4, 3, 2, 1-take with food and in the morning if you are able You may use flexeril as needed to help with pain. This is a muscle relaxer and causes sedation- please use  only at bedtime or when you will be home and not have to drive/work Hydrocodone for severe pain-do not drive or work after taking, limit use in combination with Flexeril as both medicines can cause drowsiness Gentle stretching-see attached exercises Alternate ice and heat Follow-up with orthopedics-contact below Establish care with primary care-contact below  Follow-up if any symptoms not improving  or worsening, developing any weakness in legs, issues with urination     ED Prescriptions    Medication Sig Dispense Auth. Provider   predniSONE (DELTASONE) 10 MG tablet Begin with 6 tabs on day 1, 5 tab on day 2, 4 tab on day 3, 3 tab on day 4, 2 tab on day 5, 1 tab on day 6-take with food 21 tablet Aalaya Yadao C, PA-Horn   cyclobenzaprine (FLEXERIL) 5 MG tablet Take 1-2 tablets (5-10 mg total) by mouth 2 (two) times daily as needed for muscle spasms. 24 tablet Eleah Lahaie C, PA-Horn   HYDROcodone-acetaminophen (NORCO/VICODIN) 5-325 MG tablet Take 1-2 tablets by mouth every 6 (six) hours as needed for severe pain. 8 tablet Avory Mimbs, [place] C, PA-Horn     I have reviewed the PDMP during this encounter.   Lew Dawes, [place] 12/03/19 1123

## 2019-12-03 NOTE — Discharge Instructions (Signed)
Begin prednisone taper over the next 6 days-begin with 6 tablets on day 1, decrease by 1 tablet each day until complete-6, 5, 4, 3, 2, 1-take with food and in the morning if you are able You may use flexeril as needed to help with pain. This is a muscle relaxer and causes sedation- please use only at bedtime or when you will be home and not have to drive/work Hydrocodone for severe pain-do not drive or work after taking, limit use in combination with Flexeril as both medicines can cause drowsiness Gentle stretching-see attached exercises Alternate ice and heat Follow-up with orthopedics-contact below Establish care with primary care-contact below  Follow-up if any symptoms not improving or worsening, developing any weakness in legs, issues with urination

## 2019-12-03 NOTE — ED Triage Notes (Signed)
Pt in with c/o lower back pain and right leg pain that has been going on for years. States that sometimes his right leg goes numb and he falls.  States he was supposed to be seen by [facility] and have back surgery.

## 2019-12-13 ENCOUNTER — Encounter (HOSPITAL_COMMUNITY): Payer: Self-pay | Admitting: *Deleted

## 2019-12-13 ENCOUNTER — Ambulatory Visit (HOSPITAL_COMMUNITY)
Admission: EM | Admit: 2019-12-13 | Discharge: 2019-12-13 | Disposition: A | Discharge status: Home / Self Care | Payer: Medicaid Other | Attending: Family Medicine | Admitting: Family Medicine

## 2019-12-13 ENCOUNTER — Other Ambulatory Visit: Payer: Self-pay

## 2019-12-13 DIAGNOSIS — M549 Dorsalgia, unspecified: Secondary | ICD-10-CM

## 2019-12-13 DIAGNOSIS — G8929 Other chronic pain: Secondary | ICD-10-CM

## 2019-12-13 MED ORDER — DEXAMETHASONE SODIUM PHOSPHATE 10 MG/ML IJ SOLN
INTRAMUSCULAR | Status: AC
  Filled 2019-12-13: qty 1

## 2019-12-13 MED ORDER — TIZANIDINE HCL 4 MG PO TABS: 4.0000 mg | ORAL_TABLET | Freq: Four times a day (QID) | ORAL | 0 refills | Status: DC | PRN

## 2019-12-13 MED ORDER — DEXAMETHASONE SODIUM PHOSPHATE 10 MG/ML IJ SOLN
10.0000 mg | Freq: Once | INTRAMUSCULAR | Status: AC
  Administered 2019-12-13: 14:00:00 10 mg via INTRAMUSCULAR

## 2019-12-13 NOTE — ED Provider Notes (Signed)
[facility]    CSN: [id number] Arrival date & time: 12/13/19  1213      History   Chief Complaint Chief Complaint  Patient presents with  . Back Pain    HPI Brent Horn is a 44 y.o. male.   HPI  Presents today with back pain.  Patient was seen here at urgent care on 12/03/2019 and treated for acute on chronic back pain.  Patient was prescribed a prednisone taper, muscle relaxers, and given 2 days of hydrocodone.  Patient reports taking all medication and that the prednisone nor the muscle relaxers gave him significant relief.  Patient reports that the hydrocodone did help relieve pain.  He has not taken any Tylenol or ibuprofen.  He is unable to take any NSAIDs due to history of renal disorder.  He also was given information to follow-up with Ortho and reports due to holidays has been unable to schedule appointment.  Pain remains the same and unchanged from when seen initially here in clinic.  Patient reports a history of a complete work-up and is known to have chronic degenerative disc disease.  Past Medical History:  Diagnosis Date  . Arthritis   . Chronic back pain   . Hypertension   . Kidney stone   . Polycystic kidney disease   . Renal disorder     There are no problems to display for this patient.   Past Surgical History:  Procedure Laterality Date  . LITHOTRIPSY         Home Medications    Prior to Admission medications   Medication Sig Start Date End Date Taking? Authorizing Provider  cyclobenzaprine (FLEXERIL) 5 MG tablet Take 1-2 tablets (5-10 mg total) by mouth 2 (two) times daily as needed for muscle spasms. 12/03/19   Wieters, Hallie C, PA-C  HYDROcodone-acetaminophen (NORCO/VICODIN) 5-325 MG tablet Take 1-2 tablets by mouth every 6 (six) hours as needed for severe pain. 12/03/19   Wieters, Hallie C, PA-C  predniSONE (DELTASONE) 10 MG tablet Begin with 6 tabs on day 1, 5 tab on day 2, 4 tab on day 3, 3 tab on day 4, 2 tab on day 5, 1 tab  on day 6-take with food 12/03/19   Wieters, Junius Creamer, PA-C    Family History History reviewed. No pertinent family history.  Social History Social History   Tobacco Use  . Smoking status: Current Some Day Smoker  . Smokeless tobacco: Former Clinical biochemist  . Vaping Use: Never used  Substance Use Topics  . Alcohol use: No  . Drug use: No     Allergies   Patient has no known allergies.   Review of Systems Review of Systems Pertinent negatives listed in HPI   Physical Exam Triage Vital Signs ED Triage Vitals  Enc Vitals Group     BP 12/13/19 1251 (!) 151/95     Pulse Rate 12/13/19 1251 96     Resp 12/13/19 1251 16     Temp 12/13/19 1251 98 F (36.7 C)     Temp Source 12/13/19 1251 Oral     SpO2 12/13/19 1251 99 %     Weight --      Height --      Head Circumference --      Peak Flow --      Pain Score 12/13/19 1252 8     Pain Loc --      Pain Edu? --      Excl. in GC? --  No data found.  Updated Vital Signs BP (!) 151/95   Pulse 96   Temp 98 F (36.7 C) (Oral)   Resp 16   SpO2 99%   Visual Acuity Right Eye Distance:   Left Eye Distance:   Bilateral Distance:    Right Eye Near:   Left Eye Near:    Bilateral Near:     Physical Exam Constitutional:      Appearance: He is obese.     Comments: Chronically ill appearing   Cardiovascular:     Rate and Rhythm: Normal rate and regular rhythm.  Pulmonary:     Effort: Pulmonary effort is normal.     Breath sounds: Normal breath sounds.  Musculoskeletal:     Cervical back: Normal range of motion.  Skin:    Capillary Refill: Capillary refill takes less than 2 seconds.  Neurological:     General: No focal deficit present.     Mental Status: He is alert.  Psychiatric:        Mood and Affect: Mood normal.        Behavior: Behavior normal.    UC Treatments / Results  Labs (all labs ordered are listed, but only abnormal results are displayed) Labs Reviewed - No data to  display  EKG   Radiology No results found.  Procedures Procedures (including critical care time)  Medications Ordered in UC Medications - No data to display  Initial Impression / Assessment and Plan / UC Course  I have reviewed the triage vital signs and the nursing notes.  Pertinent labs & imaging results that were available during my care of the patient were reviewed by me and considered in my medical decision making (see chart for details).     Advise patient that we are unable to refill controlled substances. Did agree to provide patient with a Decadron IM injection here in clinic to reduce inflammation and prescribe a stronger more effective muscle relaxer.  Patient advised to follow-up at [facility] office if unable to secure an appointment he can go to the walk-in clinic.  Patient verbalized understanding agreement with plan. Final Clinical Impressions(s) / UC Diagnoses   Final diagnoses:  Chronic right-sided back pain, unspecified back location   Discharge Instructions   None    ED Prescriptions    Medication Sig Dispense Auth. Provider   tiZANidine (ZANAFLEX) 4 MG tablet Take 1 tablet (4 mg total) by mouth every 6 (six) hours as needed for muscle spasms. 60 tablet Bing Neighbors, FNP     PDMP not reviewed this encounter.   Bing Neighbors, FNP 12/13/19 1433

## 2019-12-13 NOTE — ED Triage Notes (Signed)
Pt was seen 12/03/19 for back pain; states having a hard time getting into dr office for f/u with the holidays, states would like to see if he can get refill for muscle relaxer and hydrocodone until he can get in with dr.  Illene Bolus pain with numbness radiating down into RLE.

## 2019-12-21 ENCOUNTER — Other Ambulatory Visit: Payer: Self-pay | Admitting: Physical Medicine & Rehabilitation

## 2019-12-21 DIAGNOSIS — M545 Low back pain, unspecified: Secondary | ICD-10-CM

## 2020-01-06 ENCOUNTER — Other Ambulatory Visit

## 2020-04-24 ENCOUNTER — Ambulatory Visit
Admission: RE | Admit: 2020-04-24 | Discharge: 2020-04-24 | Disposition: A | Discharge status: Home / Self Care | Source: Ambulatory Visit | Attending: Physical Medicine & Rehabilitation | Admitting: Physical Medicine & Rehabilitation

## 2020-04-24 DIAGNOSIS — M545 Low back pain, unspecified: Secondary | ICD-10-CM

## 2020-04-24 IMAGING — MR MR LUMBAR SPINE W/O CM
4 of 5 series · 22 of 48 positions shown · non-contrast
Comparison: X-ray [DATE]

CLINICAL DATA: Low back pain with right-sided radiculopathy

EXAM:
MRI LUMBAR SPINE WITHOUT CONTRAST
TECHNIQUE: Multiplanar, multisequence MR imaging of the lumbar spine was
performed. No intravenous contrast was administered.

[Series 5: T2 · sagittal · 4.0mm · 0.73mm/px · 7 of 21 slices shown (1 of 2)]
[im 1/21]
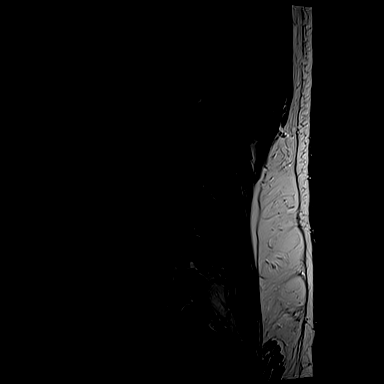
[im 4/21]
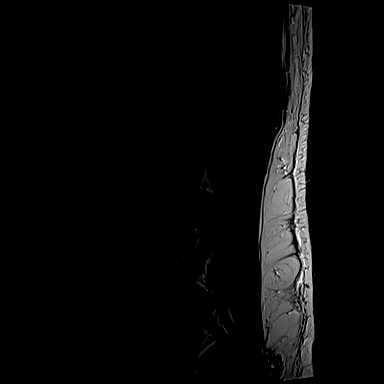
[im 7/21]
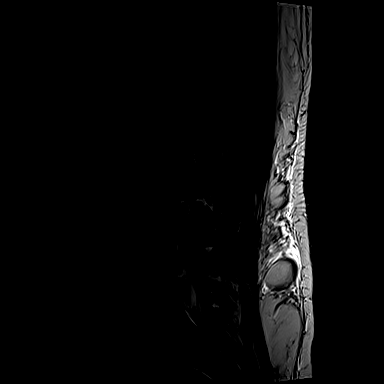
[im 11/21]
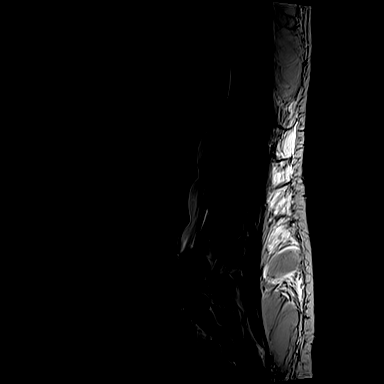
[im 14/21]
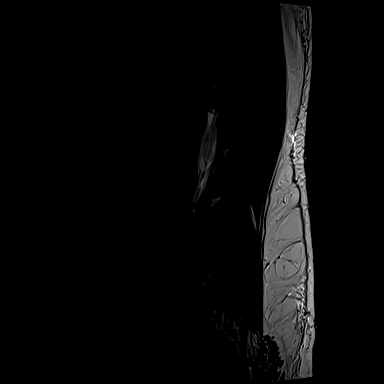
[im 17/21]
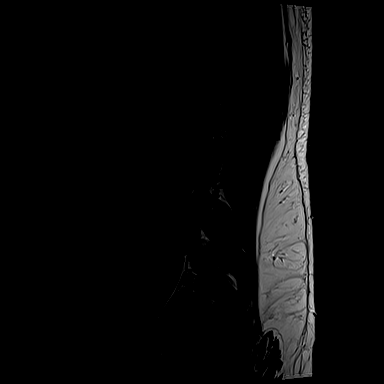
[im 21/21]
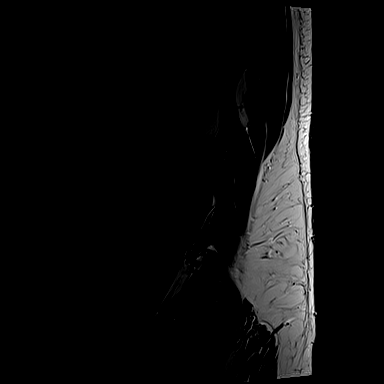

[Series 6: T1 · sagittal · 4.0mm · 0.73mm/px · 4 of 21 slices shown]
[im 1/21]
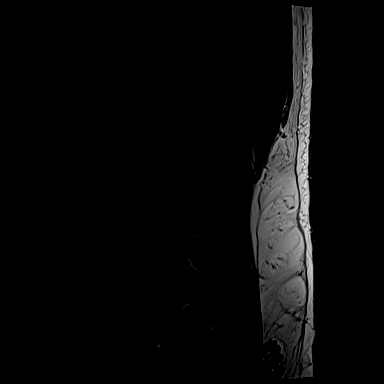
[im 4/21]
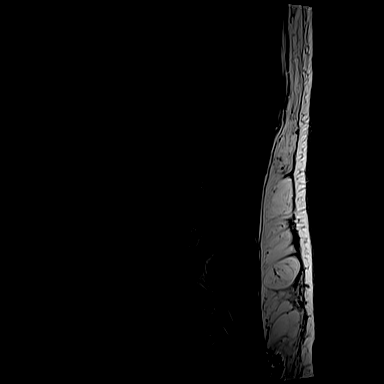
[im 11/21]
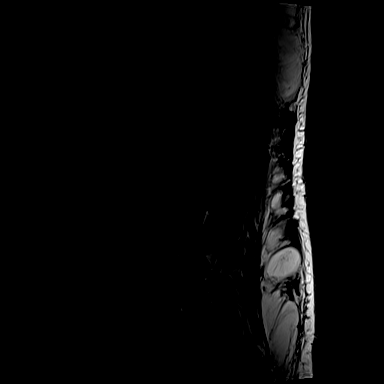
[im 17/21]
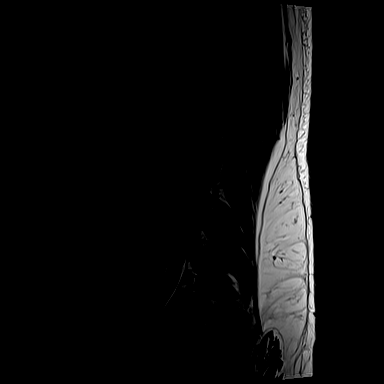

[Series 7: STIR · sagittal · 4.0mm · 0.88mm/px · 3 of 21 slices shown]
[im 5/21]
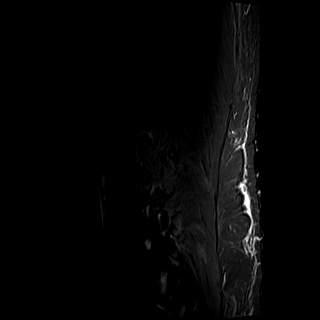
[im 13/21]
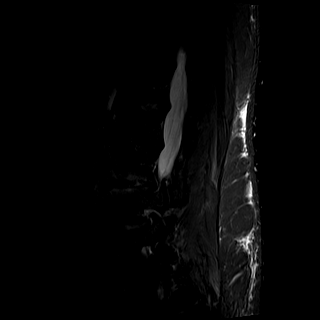
[im 21/21]
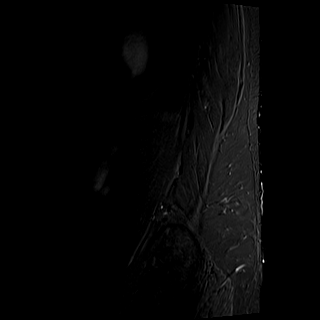

[Series 12: T2 · axial · 4.0mm · 0.28mm/px · z∈[-134,+97]mm · 8 of 47 slices shown (2 of 2)]
[im 1/47]
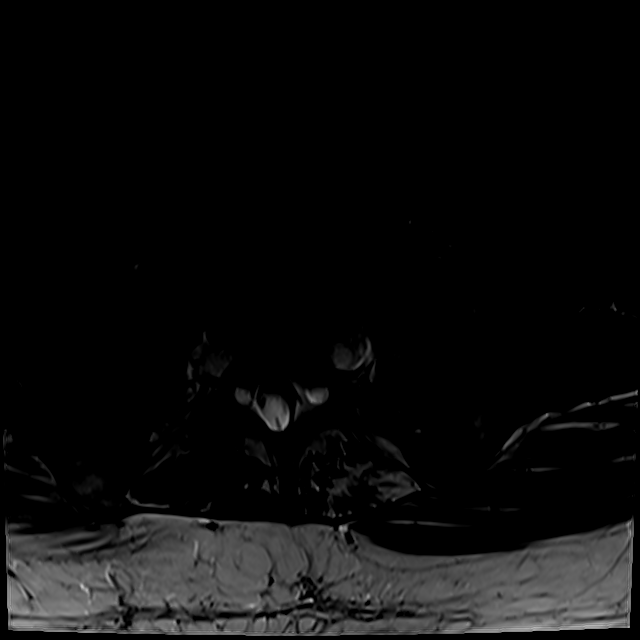
[im 8/47]
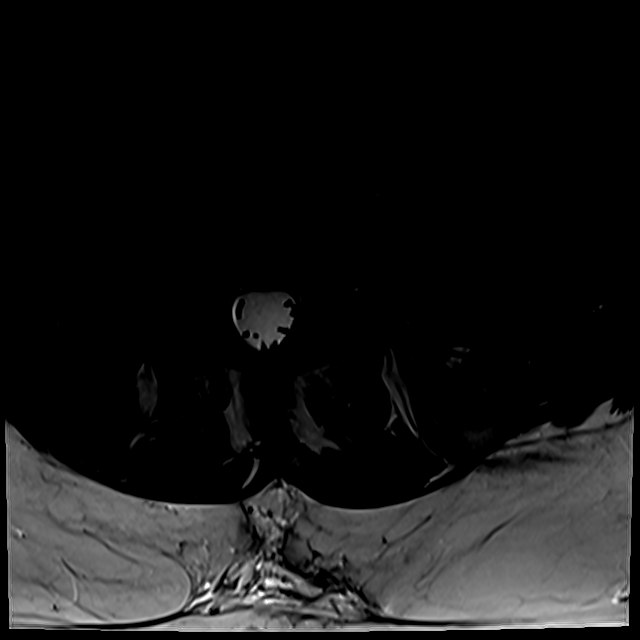
[im 15/47]
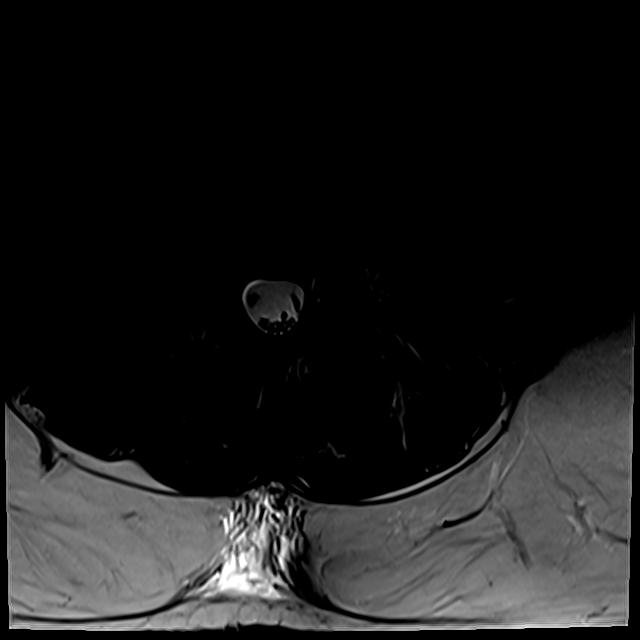
[im 22/47]
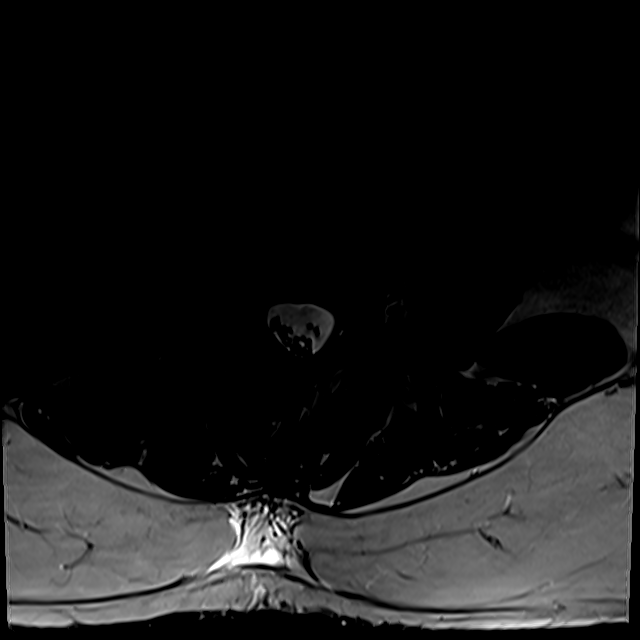
[im 25/47]
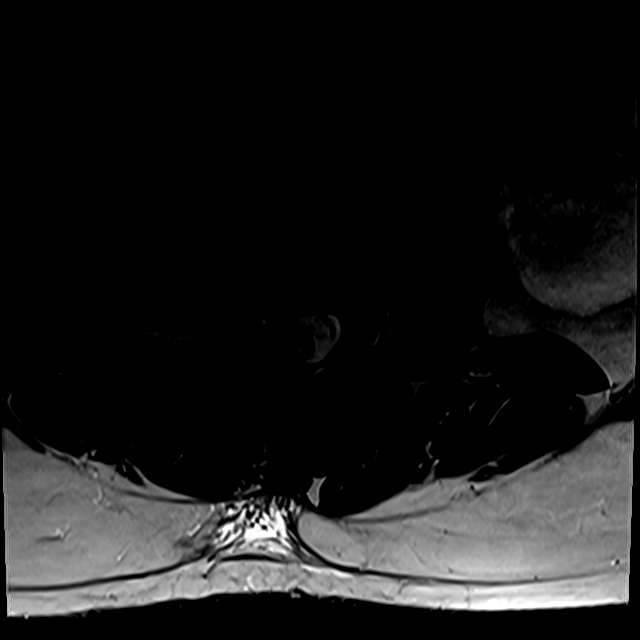
[im 32/47]
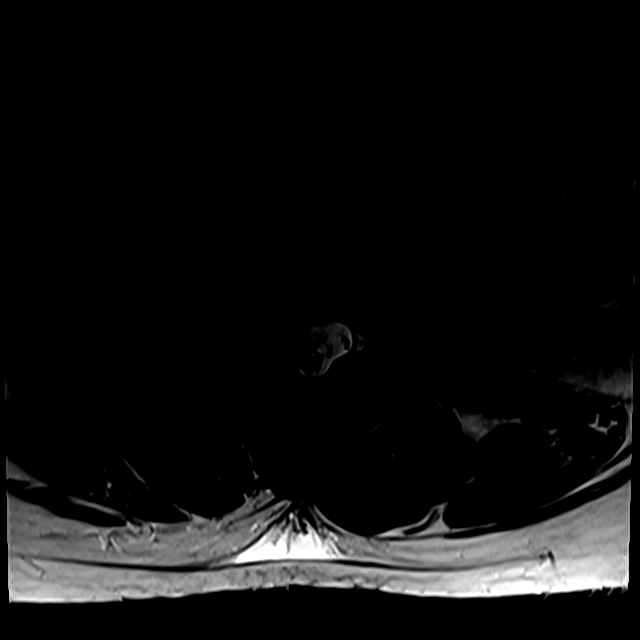
[im 39/47]
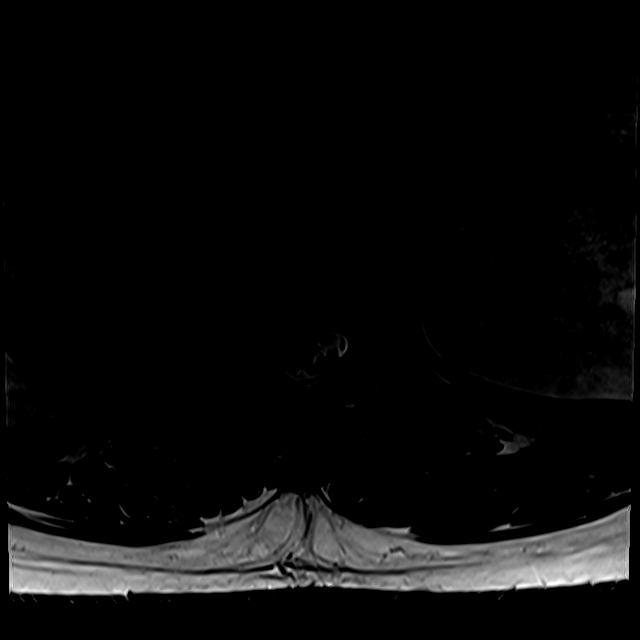
[im 47/47]
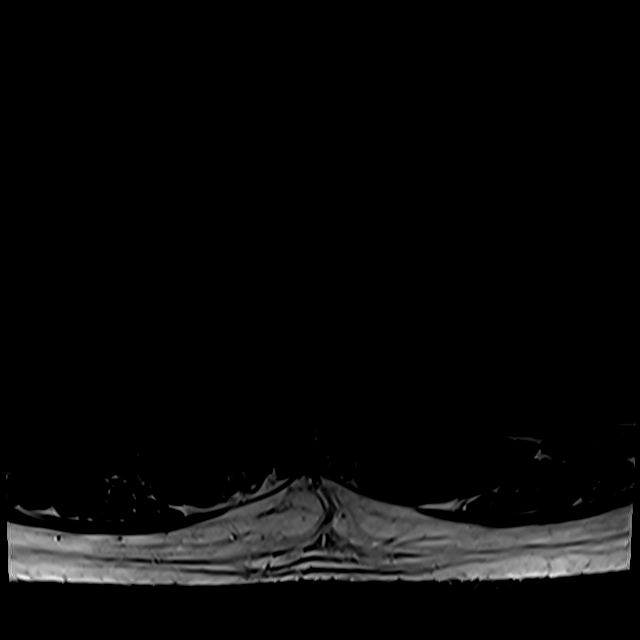

[22 of 48 positions shown; findings below may reference images not displayed]

FINDINGS: Segmentation:  Standard.

Alignment:  Lumbar levocurvature.  Trace retrolisthesis L3 on L4.

Vertebrae: No fracture, evidence of discitis, or bone lesion.
Multilevel discogenic endplate marrow changes.

Conus medullaris and cauda equina: Conus extends to the L1 level.
Conus and cauda equina appear normal.

Paraspinal and other soft tissues: Partially visualized numerous
bilateral renal cysts.

Disc levels:

T12-L1: Shallow central disc protrusion. No foraminal or canal
stenosis.

L1-L2: Right-sided disc bulge with small right paracentral disc
protrusion resulting in mild right foraminal stenosis. No canal
stenosis.

L2-L3: Right-sided disc bulge with prominent right lateral endplate
osteophytes. Findings result in mild right subarticular recess
stenosis. No significant foraminal or canal stenosis.

L3-L4: Mild broad-based disc bulge with predominantly right-sided
lateral endplate osteophytes. Bilateral facet arthropathy. Findings
result in mild-to-moderate bilateral foraminal stenosis without
canal stenosis.

L4-L5: Minimal diffuse disc bulge with left greater than right facet
arthropathy. Findings result in moderate to severe left foraminal
stenosis without canal stenosis.

L5-S1: No significant disc protrusion. Mild bilateral facet
arthropathy. Mild left foraminal stenosis without canal stenosis.
IMPRESSION: 1. Multilevel degenerative changes resulting in moderate-to-severe
left foraminal stenosis at L4-L5 and mild-to-moderate bilateral
foraminal stenosis at L3-L4.
2. No significant canal stenosis at any level.
3. Lumbar levocurvature.

## 2021-01-14 ENCOUNTER — Inpatient Hospital Stay (HOSPITAL_COMMUNITY): Payer: Medicaid Other

## 2021-01-14 ENCOUNTER — Inpatient Hospital Stay (HOSPITAL_COMMUNITY)
Admission: EM | Admit: 2021-01-14 | Discharge: 2021-02-15 | DRG: 917 | Disposition: E | Payer: Medicaid Other | Attending: Pulmonary Disease | Admitting: Pulmonary Disease

## 2021-01-14 ENCOUNTER — Emergency Department (HOSPITAL_COMMUNITY): Payer: Medicaid Other

## 2021-01-14 DIAGNOSIS — I10 Essential (primary) hypertension: Secondary | ICD-10-CM | POA: Diagnosis present

## 2021-01-14 DIAGNOSIS — N179 Acute kidney failure, unspecified: Secondary | ICD-10-CM | POA: Diagnosis present

## 2021-01-14 DIAGNOSIS — I469 Cardiac arrest, cause unspecified: Secondary | ICD-10-CM | POA: Diagnosis present

## 2021-01-14 DIAGNOSIS — G931 Anoxic brain damage, not elsewhere classified: Secondary | ICD-10-CM | POA: Diagnosis not present

## 2021-01-14 DIAGNOSIS — M549 Dorsalgia, unspecified: Secondary | ICD-10-CM | POA: Diagnosis present

## 2021-01-14 DIAGNOSIS — Z6841 Body Mass Index (BMI) 40.0 and over, adult: Secondary | ICD-10-CM

## 2021-01-14 DIAGNOSIS — R609 Edema, unspecified: Secondary | ICD-10-CM | POA: Diagnosis not present

## 2021-01-14 DIAGNOSIS — G8191 Hemiplegia, unspecified affecting right dominant side: Secondary | ICD-10-CM | POA: Diagnosis not present

## 2021-01-14 DIAGNOSIS — Z452 Encounter for adjustment and management of vascular access device: Secondary | ICD-10-CM

## 2021-01-14 DIAGNOSIS — F1410 Cocaine abuse, uncomplicated: Secondary | ICD-10-CM | POA: Diagnosis present

## 2021-01-14 DIAGNOSIS — Z529 Donor of unspecified organ or tissue: Secondary | ICD-10-CM

## 2021-01-14 DIAGNOSIS — Z4659 Encounter for fitting and adjustment of other gastrointestinal appliance and device: Secondary | ICD-10-CM

## 2021-01-14 DIAGNOSIS — R34 Anuria and oliguria: Secondary | ICD-10-CM | POA: Diagnosis not present

## 2021-01-14 DIAGNOSIS — I5181 Takotsubo syndrome: Secondary | ICD-10-CM | POA: Diagnosis not present

## 2021-01-14 DIAGNOSIS — R6521 Severe sepsis with septic shock: Secondary | ICD-10-CM | POA: Diagnosis present

## 2021-01-14 DIAGNOSIS — J690 Pneumonitis due to inhalation of food and vomit: Secondary | ICD-10-CM | POA: Diagnosis present

## 2021-01-14 DIAGNOSIS — Z7189 Other specified counseling: Secondary | ICD-10-CM

## 2021-01-14 DIAGNOSIS — J9601 Acute respiratory failure with hypoxia: Secondary | ICD-10-CM | POA: Diagnosis not present

## 2021-01-14 DIAGNOSIS — T405X1A Poisoning by cocaine, accidental (unintentional), initial encounter: Principal | ICD-10-CM | POA: Diagnosis present

## 2021-01-14 DIAGNOSIS — J80 Acute respiratory distress syndrome: Secondary | ICD-10-CM | POA: Diagnosis present

## 2021-01-14 DIAGNOSIS — I7779 Dissection of other artery: Secondary | ICD-10-CM | POA: Diagnosis not present

## 2021-01-14 DIAGNOSIS — Z1389 Encounter for screening for other disorder: Secondary | ICD-10-CM

## 2021-01-14 DIAGNOSIS — G8929 Other chronic pain: Secondary | ICD-10-CM | POA: Diagnosis present

## 2021-01-14 DIAGNOSIS — I6340 Cerebral infarction due to embolism of unspecified cerebral artery: Secondary | ICD-10-CM | POA: Insufficient documentation

## 2021-01-14 DIAGNOSIS — G936 Cerebral edema: Secondary | ICD-10-CM | POA: Diagnosis not present

## 2021-01-14 DIAGNOSIS — Z66 Do not resuscitate: Secondary | ICD-10-CM | POA: Diagnosis not present

## 2021-01-14 DIAGNOSIS — E871 Hypo-osmolality and hyponatremia: Secondary | ICD-10-CM | POA: Diagnosis present

## 2021-01-14 DIAGNOSIS — Y712 Prosthetic and other implants, materials and accessory cardiovascular devices associated with adverse incidents: Secondary | ICD-10-CM | POA: Diagnosis not present

## 2021-01-14 DIAGNOSIS — M1990 Unspecified osteoarthritis, unspecified site: Secondary | ICD-10-CM | POA: Diagnosis present

## 2021-01-14 DIAGNOSIS — Z515 Encounter for palliative care: Secondary | ICD-10-CM | POA: Diagnosis not present

## 2021-01-14 DIAGNOSIS — A419 Sepsis, unspecified organism: Secondary | ICD-10-CM | POA: Diagnosis present

## 2021-01-14 DIAGNOSIS — R0603 Acute respiratory distress: Secondary | ICD-10-CM | POA: Diagnosis present

## 2021-01-14 DIAGNOSIS — I9751 Accidental puncture and laceration of a circulatory system organ or structure during a circulatory system procedure: Secondary | ICD-10-CM | POA: Diagnosis not present

## 2021-01-14 DIAGNOSIS — I63419 Cerebral infarction due to embolism of unspecified middle cerebral artery: Secondary | ICD-10-CM | POA: Diagnosis not present

## 2021-01-14 DIAGNOSIS — Q613 Polycystic kidney, unspecified: Secondary | ICD-10-CM

## 2021-01-14 DIAGNOSIS — R042 Hemoptysis: Secondary | ICD-10-CM | POA: Diagnosis present

## 2021-01-14 DIAGNOSIS — I63412 Cerebral infarction due to embolism of left middle cerebral artery: Secondary | ICD-10-CM | POA: Diagnosis not present

## 2021-01-14 DIAGNOSIS — F17200 Nicotine dependence, unspecified, uncomplicated: Secondary | ICD-10-CM | POA: Diagnosis present

## 2021-01-14 DIAGNOSIS — Y658 Other specified misadventures during surgical and medical care: Secondary | ICD-10-CM | POA: Diagnosis not present

## 2021-01-14 DIAGNOSIS — Z20822 Contact with and (suspected) exposure to covid-19: Secondary | ICD-10-CM | POA: Diagnosis present

## 2021-01-14 LAB — COMPREHENSIVE METABOLIC PANEL
ALT: 23 U/L (ref 0–44)
ALT: 22 U/L (ref 0–44)
AST: 26 U/L (ref 15–41)
AST: 28 U/L (ref 15–41)
Albumin: 3.2 g/dL — ABNORMAL LOW (ref 3.5–5.0)
Albumin: 3.1 g/dL — ABNORMAL LOW (ref 3.5–5.0)
Alkaline Phosphatase: 71 U/L (ref 38–126)
Alkaline Phosphatase: 71 U/L (ref 38–126)
Anion gap: 18 — ABNORMAL HIGH (ref 5–15)
Anion gap: 13 (ref 5–15)
BUN: 13 mg/dL (ref 6–20)
BUN: 15 mg/dL (ref 6–20)
CO2: 19 mmol/L — ABNORMAL LOW (ref 22–32)
CO2: 18 mmol/L — ABNORMAL LOW (ref 22–32)
Calcium: 9.4 mg/dL (ref 8.9–10.3)
Calcium: 8.7 mg/dL — ABNORMAL LOW (ref 8.9–10.3)
Chloride: 100 mmol/L (ref 98–111)
Chloride: 105 mmol/L (ref 98–111)
Creatinine, Ser: 1.6 mg/dL — ABNORMAL HIGH (ref 0.61–1.24)
Creatinine, Ser: 2.02 mg/dL — ABNORMAL HIGH (ref 0.61–1.24)
GFR, Estimated: 53 mL/min — ABNORMAL LOW (ref >60)
GFR, Estimated: 40 mL/min — ABNORMAL LOW (ref >60)
Glucose, Bld: 179 mg/dL — ABNORMAL HIGH (ref 70–99)
Glucose, Bld: 100 mg/dL — ABNORMAL HIGH (ref 70–99)
Potassium: 3.7 mmol/L (ref 3.5–5.1)
Potassium: 3.8 mmol/L (ref 3.5–5.1)
Sodium: 137 mmol/L (ref 135–145)
Sodium: 136 mmol/L (ref 135–145)
Total Bilirubin: 0.9 mg/dL (ref 0.3–1.2)
Total Bilirubin: 1 mg/dL (ref 0.3–1.2)
Total Protein: 7.1 g/dL (ref 6.5–8.1)
Total Protein: 7.2 g/dL (ref 6.5–8.1)

## 2021-01-14 LAB — CBC WITH DIFFERENTIAL/PLATELET
Abs Immature Granulocytes: 0.13 10*3/uL — ABNORMAL HIGH (ref 0.00–0.07)
Basophils Absolute: 0 10*3/uL (ref 0.0–0.1)
Basophils Relative: 0 %
Eosinophils Absolute: 0.1 10*3/uL (ref 0.0–0.5)
Eosinophils Relative: 0 %
HCT: 58.1 % — ABNORMAL HIGH (ref 39.0–52.0)
Hemoglobin: 19.5 g/dL — ABNORMAL HIGH (ref 13.0–17.0)
Immature Granulocytes: 1 %
Lymphocytes Relative: 6 %
Lymphs Abs: 1.3 10*3/uL (ref 0.7–4.0)
MCH: 30.5 pg (ref 26.0–34.0)
MCHC: 33.6 g/dL (ref 30.0–36.0)
MCV: 90.8 fL (ref 80.0–100.0)
Monocytes Absolute: 0.4 10*3/uL (ref 0.1–1.0)
Monocytes Relative: 2 %
Neutro Abs: 19.3 10*3/uL — ABNORMAL HIGH (ref 1.7–7.7)
Neutrophils Relative %: 91 %
Platelets: 267 10*3/uL (ref 150–400)
RBC: 6.4 MIL/uL — ABNORMAL HIGH (ref 4.22–5.81)
RDW: 13.4 % (ref 11.5–15.5)
WBC: 21.2 10*3/uL — ABNORMAL HIGH (ref 4.0–10.5)
nRBC: 0 % (ref 0.0–0.2)

## 2021-01-14 LAB — I-STAT CHEM 8, ED
BUN: 16 mg/dL (ref 6–20)
Calcium, Ion: 1.1 mmol/L — ABNORMAL LOW (ref 1.15–1.40)
Chloride: 103 mmol/L (ref 98–111)
Creatinine, Ser: 1.4 mg/dL — ABNORMAL HIGH (ref 0.61–1.24)
Glucose, Bld: 177 mg/dL — ABNORMAL HIGH (ref 70–99)
HCT: 60 % — ABNORMAL HIGH (ref 39.0–52.0)
Hemoglobin: 20.4 g/dL — ABNORMAL HIGH (ref 13.0–17.0)
Potassium: 3.6 mmol/L (ref 3.5–5.1)
Sodium: 138 mmol/L (ref 135–145)
TCO2: 22 mmol/L (ref 22–32)

## 2021-01-14 LAB — POCT I-STAT 7, (LYTES, BLD GAS, ICA,H+H)
Acid-base deficit: 9 mmol/L — ABNORMAL HIGH (ref 0.0–2.0)
Acid-base deficit: 8 mmol/L — ABNORMAL HIGH (ref 0.0–2.0)
Acid-base deficit: 9 mmol/L — ABNORMAL HIGH (ref 0.0–2.0)
Bicarbonate: 22.1 mmol/L (ref 20.0–28.0)
Bicarbonate: 19.4 mmol/L — ABNORMAL LOW (ref 20.0–28.0)
Bicarbonate: 17.2 mmol/L — ABNORMAL LOW (ref 20.0–28.0)
Calcium, Ion: 1.26 mmol/L (ref 1.15–1.40)
Calcium, Ion: 1.1 mmol/L — ABNORMAL LOW (ref 1.15–1.40)
Calcium, Ion: 1.11 mmol/L — ABNORMAL LOW (ref 1.15–1.40)
HCT: 60 % — ABNORMAL HIGH (ref 39.0–52.0)
HCT: 49 % (ref 39.0–52.0)
HCT: 49 % (ref 39.0–52.0)
Hemoglobin: 20.4 g/dL — ABNORMAL HIGH (ref 13.0–17.0)
Hemoglobin: 16.7 g/dL (ref 13.0–17.0)
Hemoglobin: 16.7 g/dL (ref 13.0–17.0)
O2 Saturation: 87 %
O2 Saturation: 100 %
O2 Saturation: 98 %
Patient temperature: 38.2
Patient temperature: 37.9
Patient temperature: 37.5
Potassium: 3.8 mmol/L (ref 3.5–5.1)
Potassium: 3.6 mmol/L (ref 3.5–5.1)
Potassium: 3.7 mmol/L (ref 3.5–5.1)
Sodium: 139 mmol/L (ref 135–145)
Sodium: 138 mmol/L (ref 135–145)
Sodium: 136 mmol/L (ref 135–145)
TCO2: 24 mmol/L (ref 22–32)
TCO2: 21 mmol/L — ABNORMAL LOW (ref 22–32)
TCO2: 18 mmol/L — ABNORMAL LOW (ref 22–32)
pCO2 arterial: 70.7 mmHg (ref 32.0–48.0)
pCO2 arterial: 47.5 mmHg (ref 32.0–48.0)
pCO2 arterial: 39.8 mmHg (ref 32.0–48.0)
pH, Arterial: 7.111 — CL (ref 7.350–7.450)
pH, Arterial: 7.222 — ABNORMAL LOW (ref 7.350–7.450)
pH, Arterial: 7.245 — ABNORMAL LOW (ref 7.350–7.450)
pO2, Arterial: 78 mmHg — ABNORMAL LOW (ref 83.0–108.0)
pO2, Arterial: 412 mmHg — ABNORMAL HIGH (ref 83.0–108.0)
pO2, Arterial: 120 mmHg — ABNORMAL HIGH (ref 83.0–108.0)

## 2021-01-14 LAB — URINALYSIS, ROUTINE W REFLEX MICROSCOPIC
Bilirubin Urine: NEGATIVE
Glucose, UA: NEGATIVE mg/dL
Ketones, ur: NEGATIVE mg/dL
Leukocytes,Ua: NEGATIVE
Nitrite: NEGATIVE
Protein, ur: NEGATIVE mg/dL
Specific Gravity, Urine: 1.008 (ref 1.005–1.030)
pH: 6 (ref 5.0–8.0)

## 2021-01-14 LAB — RAPID URINE DRUG SCREEN, HOSP PERFORMED
Amphetamines: NOT DETECTED
Barbiturates: NOT DETECTED
Benzodiazepines: NOT DETECTED
Cocaine: POSITIVE — AB
Opiates: NOT DETECTED
Tetrahydrocannabinol: POSITIVE — AB

## 2021-01-14 LAB — HEMOGLOBIN A1C
Hgb A1c MFr Bld: 5.8 % — ABNORMAL HIGH (ref 4.8–5.6)
Mean Plasma Glucose: 119.76 mg/dL

## 2021-01-14 LAB — DIC (DISSEMINATED INTRAVASCULAR COAGULATION)PANEL
D-Dimer, Quant: >20 ug/mL-FEU — ABNORMAL HIGH (ref 0.00–0.50)
Fibrinogen: 445 mg/dL (ref 210–475)
INR: 1.1 (ref 0.8–1.2)
Platelets: 257 10*3/uL (ref 150–400)
Prothrombin Time: 14.6 seconds (ref 11.4–15.2)
Smear Review: NONE SEEN
aPTT: 31 seconds (ref 24–36)

## 2021-01-14 LAB — PROTIME-INR
INR: 1.1 (ref 0.8–1.2)
INR: 1.1 (ref 0.8–1.2)
Prothrombin Time: 13.7 seconds (ref 11.4–15.2)
Prothrombin Time: 14 seconds (ref 11.4–15.2)

## 2021-01-14 LAB — TYPE AND SCREEN
ABO/RH(D): O POS
Antibody Screen: NEGATIVE

## 2021-01-14 LAB — GLUCOSE, CAPILLARY
Glucose-Capillary: 141 mg/dL — ABNORMAL HIGH (ref 70–99)
Glucose-Capillary: 213 mg/dL — ABNORMAL HIGH (ref 70–99)

## 2021-01-14 LAB — TROPONIN I (HIGH SENSITIVITY)
Troponin I (High Sensitivity): 15 ng/L (ref <18)
Troponin I (High Sensitivity): 67 ng/L — ABNORMAL HIGH (ref <18)

## 2021-01-14 LAB — APTT: aPTT: 34 seconds (ref 24–36)

## 2021-01-14 LAB — RESP PANEL BY RT-PCR (FLU A&B, COVID) ARPGX2
Influenza A by PCR: NEGATIVE
Influenza B by PCR: NEGATIVE
SARS Coronavirus 2 by RT PCR: NEGATIVE

## 2021-01-14 LAB — HIV ANTIBODY (ROUTINE TESTING W REFLEX): HIV Screen 4th Generation wRfx: NONREACTIVE

## 2021-01-14 LAB — BRAIN NATRIURETIC PEPTIDE: B Natriuretic Peptide: 35.1 pg/mL (ref 0.0–100.0)

## 2021-01-14 LAB — LACTIC ACID, PLASMA
Lactic Acid, Venous: 4.3 mmol/L (ref 0.5–1.9)
Lactic Acid, Venous: 2.4 mmol/L (ref 0.5–1.9)
Lactic Acid, Venous: 4.2 mmol/L (ref 0.5–1.9)

## 2021-01-14 LAB — MRSA NEXT GEN BY PCR, NASAL: MRSA by PCR Next Gen: NOT DETECTED

## 2021-01-14 IMAGING — DX DG CHEST 1V PORT
1 series · 1 of 1 positions shown · non-contrast
Comparison: Radiograph of same day.

CLINICAL DATA: Central line placement.

EXAM:
PORTABLE CHEST 1 VIEW

[chest]
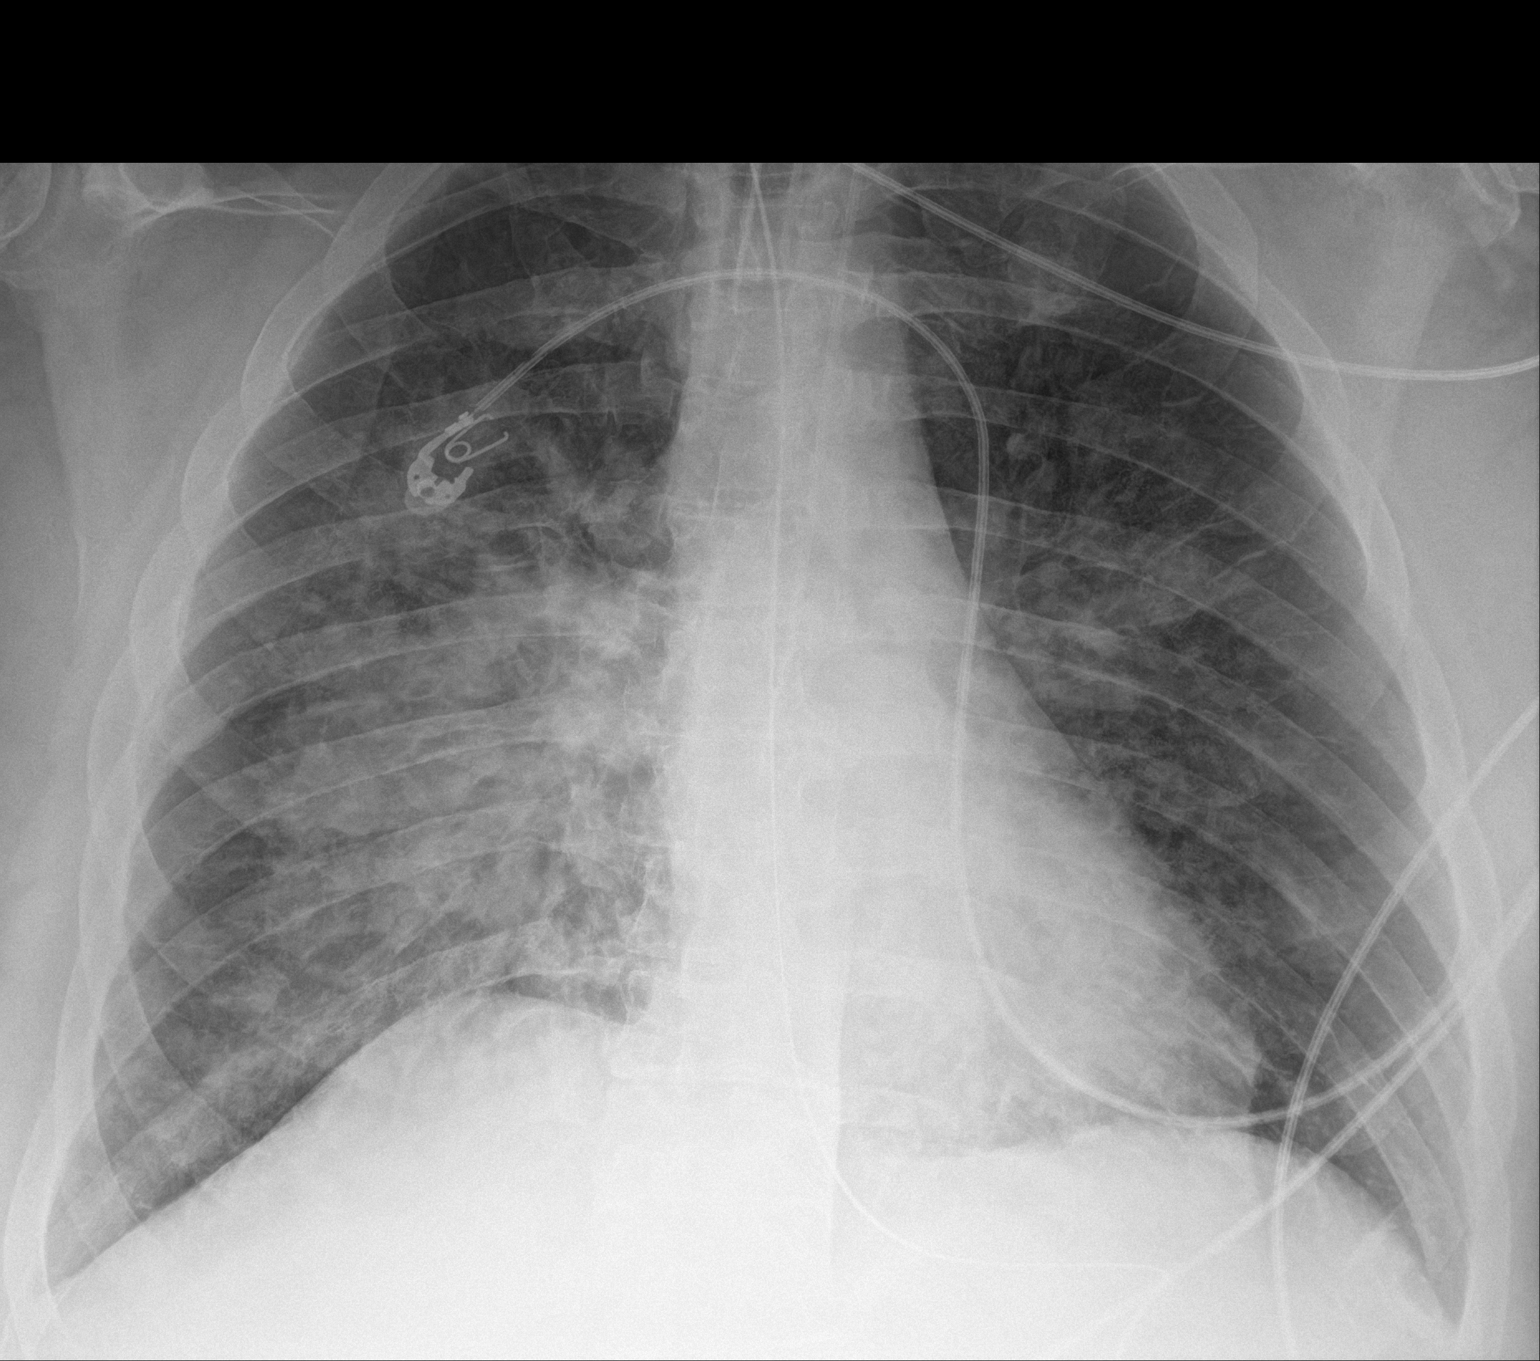

[1 of 1 positions shown; findings below may reference images not displayed]

FINDINGS: The heart size and mediastinal contours are within normal limits.
Endotracheal and nasogastric tubes are unchanged. Bilateral lung
opacities are noted, right greater than left, concerning for
pneumonia or possibly edema. There is been interval placement of
left-sided catheter with distal tip projected over the left side of
the midthoracic spine which is not typical for for jugular
placement. The visualized skeletal structures are unremarkable.
IMPRESSION: Interval placement of left-sided catheter with distal tip projected
over left side of midthoracic spine which is not typical for
internal jugular placement. This is concerning for possible
inadvertent arterial puncture with tip in thoracic aorta. These
results will be called to the ordering clinician or representative
by the Radiologist Assistant, and communication documented in the
PACS or zVision Dashboard.

## 2021-01-14 IMAGING — DX DG CHEST 1V PORT
1 series · 1 of 1 positions shown · non-contrast
Comparison: [DATE]

CLINICAL DATA: Patient presents from home unresponsive.

EXAM:
PORTABLE CHEST 1 VIEW

[chest]
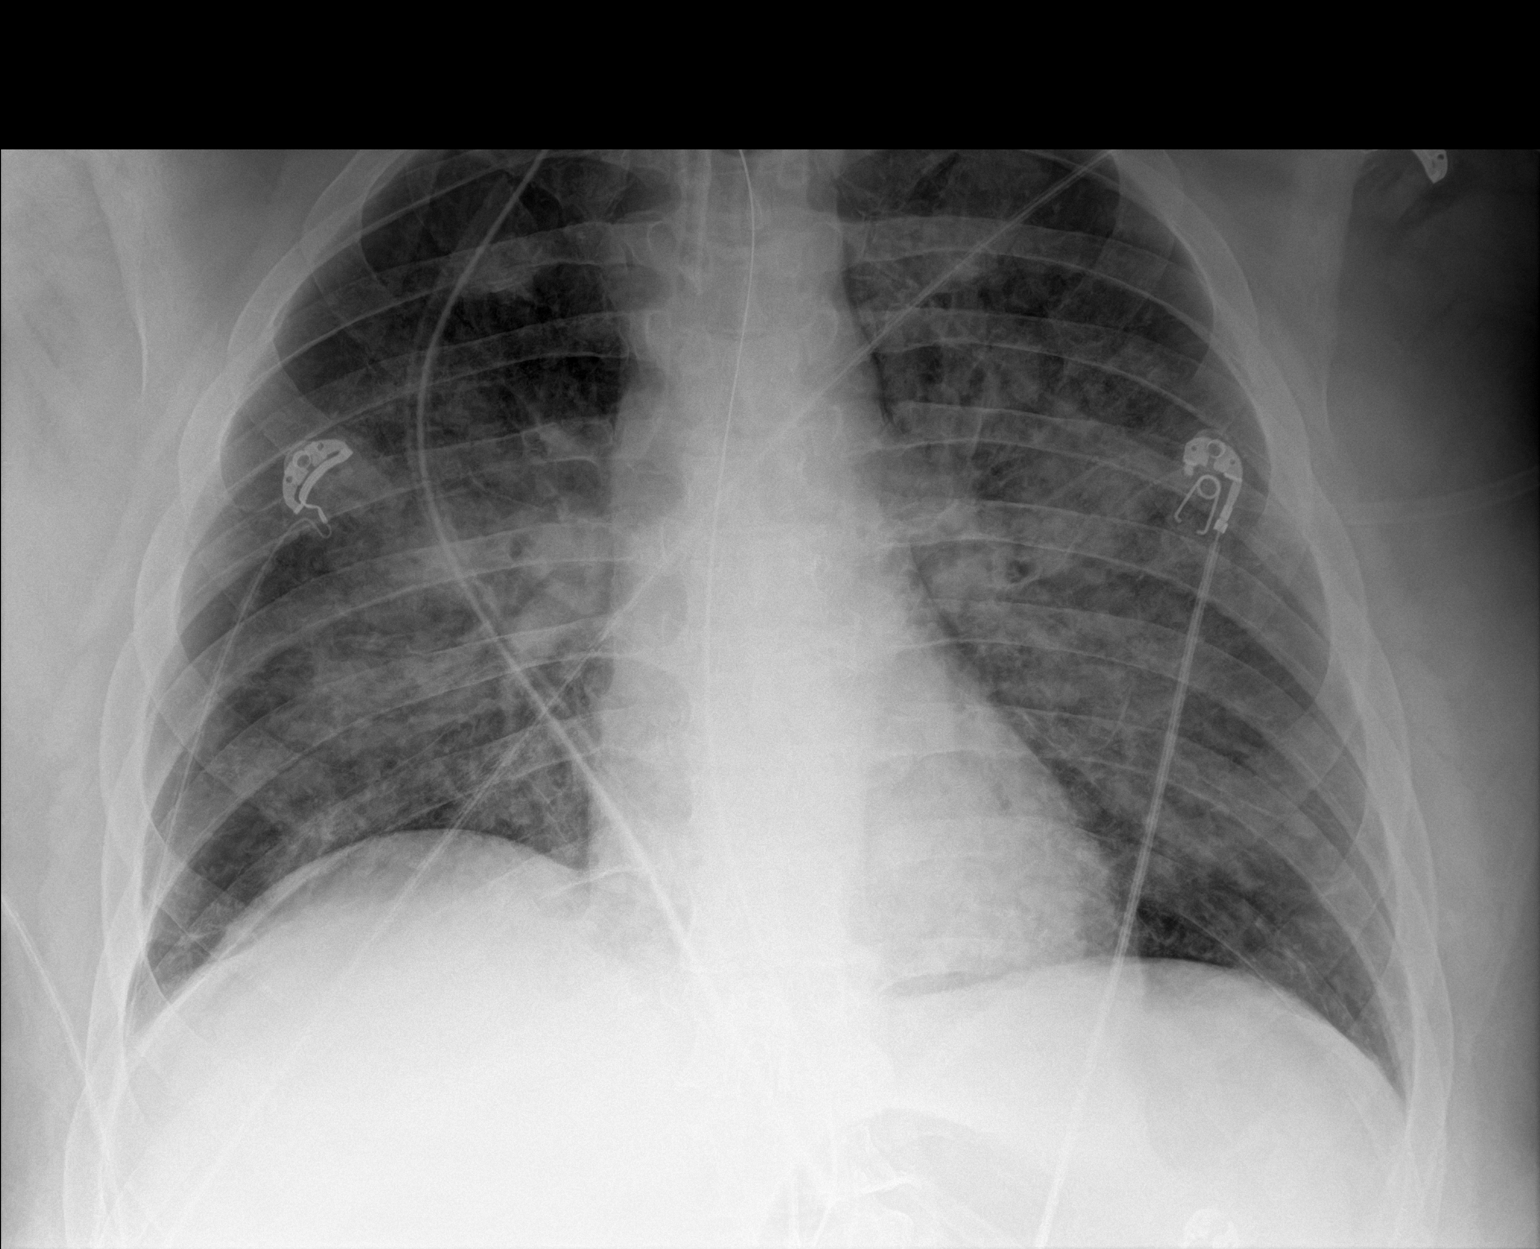

[1 of 1 positions shown; findings below may reference images not displayed]

FINDINGS: Bilateral hazy airspace lung opacities are the central prominence.

No convincing pleural effusion or pneumothorax on the semi-erect
study.

Cardiac silhouette normal in size.  No mediastinal or hilar masses.

Endotracheal tube tip projects 3.4 cm above the Carina.
Nasal/orogastric tube passes well below the diaphragm, into the
stomach and below the included field of view.
IMPRESSION: 1. Hazy bilateral airspace lung opacities, pattern suspected to be
pulmonary edema, although multifocal pneumonia is also possible.
2. Well-positioned endotracheal tube and nasal/orogastric tube.

## 2021-01-14 MED ORDER — MIDAZOLAM HCL 2 MG/2ML IJ SOLN
INTRAMUSCULAR | Status: AC
  Filled 2021-01-14: qty 2

## 2021-01-14 MED ORDER — NOREPINEPHRINE 4 MG/250ML-% IV SOLN
INTRAVENOUS | Status: DC | PRN
  Administered 2021-01-14: 20 ug/min via INTRAVENOUS
  Administered 2021-01-14 (×2): 4 mg via INTRAVENOUS
  Administered 2021-01-14: 44 mg via INTRAVENOUS

## 2021-01-14 MED ORDER — ETOMIDATE 2 MG/ML IV SOLN
INTRAVENOUS | Status: AC
  Filled 2021-01-14: qty 20

## 2021-01-14 MED ORDER — ACETAMINOPHEN 160 MG/5ML PO SOLN
650.0000 mg | ORAL | Status: DC | PRN
  Administered 2021-01-14: 650 mg

## 2021-01-14 MED ORDER — CHLORHEXIDINE GLUCONATE 0.12% ORAL RINSE (MEDLINE KIT)
15.0000 mL | Freq: Two times a day (BID) | OROMUCOSAL | Status: DC
  Administered 2021-01-14 – 2021-01-18 (×9): 15 mL via OROMUCOSAL

## 2021-01-14 MED ORDER — FENTANYL 2500MCG IN NS 250ML (10MCG/ML) PREMIX INFUSION
25.0000 ug/h | INTRAVENOUS | Status: DC
  Administered 2021-01-14: 50 ug/h via INTRAVENOUS
  Administered 2021-01-15: 150 ug/h via INTRAVENOUS
  Administered 2021-01-16 (×2): 200 ug/h via INTRAVENOUS
  Administered 2021-01-16: 175 ug/h via INTRAVENOUS
  Administered 2021-01-17 – 2021-01-18 (×4): 300 ug/h via INTRAVENOUS
  Administered 2021-01-18: 400 ug/h via INTRAVENOUS
  Administered 2021-01-18: 300 ug/h via INTRAVENOUS
  Administered 2021-01-19 (×3): 400 ug/h via INTRAVENOUS
  Filled 2021-01-14 (×14): qty 250

## 2021-01-14 MED ORDER — FENTANYL CITRATE PF 50 MCG/ML IJ SOSY: 50.0000 ug | PREFILLED_SYRINGE | Freq: Once | INTRAMUSCULAR | Status: AC

## 2021-01-14 MED ORDER — DOCUSATE SODIUM 100 MG PO CAPS: 100.0000 mg | ORAL_CAPSULE | Freq: Two times a day (BID) | ORAL | Status: DC | PRN

## 2021-01-14 MED ORDER — STERILE WATER FOR INJECTION IJ SOLN
50.0000 ng/kg/min | INTRAVENOUS | Status: DC
  Administered 2021-01-14 – 2021-01-15 (×4): 50 ng/kg/min via RESPIRATORY_TRACT
  Filled 2021-01-14 (×5): qty 5

## 2021-01-14 MED ORDER — PROPOFOL 1000 MG/100ML IV EMUL
0.0000 ug/kg/min | INTRAVENOUS | Status: DC
  Administered 2021-01-14: 5 ug/kg/min via INTRAVENOUS

## 2021-01-14 MED ORDER — DOCUSATE SODIUM 50 MG/5ML PO LIQD
100.0000 mg | Freq: Two times a day (BID) | ORAL | Status: DC
  Administered 2021-01-14 – 2021-01-18 (×9): 100 mg
  Filled 2021-01-14 (×9): qty 10

## 2021-01-14 MED ORDER — PROPOFOL 1000 MG/100ML IV EMUL
0.0000 ug/kg/min | INTRAVENOUS | Status: DC
  Administered 2021-01-14 – 2021-01-15 (×6): 35 ug/kg/min via INTRAVENOUS
  Administered 2021-01-16 (×5): 40 ug/kg/min via INTRAVENOUS
  Administered 2021-01-16: 35 ug/kg/min via INTRAVENOUS
  Administered 2021-01-16 (×2): 40 ug/kg/min via INTRAVENOUS
  Administered 2021-01-17 – 2021-01-18 (×11): 50 ug/kg/min via INTRAVENOUS
  Administered 2021-01-18: 60 ug/kg/min via INTRAVENOUS
  Administered 2021-01-18 (×3): 50 ug/kg/min via INTRAVENOUS
  Administered 2021-01-18 – 2021-01-19 (×11): 60 ug/kg/min via INTRAVENOUS
  Filled 2021-01-14 (×41): qty 100

## 2021-01-14 MED ORDER — ARTIFICIAL TEARS OPHTHALMIC OINT
1.0000 "application " | TOPICAL_OINTMENT | Freq: Three times a day (TID) | OPHTHALMIC | Status: DC
  Administered 2021-01-14 – 2021-01-19 (×15): 1 via OPHTHALMIC
  Filled 2021-01-14 (×2): qty 3.5

## 2021-01-14 MED ORDER — PROPOFOL 1000 MG/100ML IV EMUL
INTRAVENOUS | Status: AC
  Filled 2021-01-14: qty 100

## 2021-01-14 MED ORDER — ETOMIDATE 2 MG/ML IV SOLN
INTRAVENOUS | Status: DC | PRN
  Administered 2021-01-14: 30 mg via INTRAVENOUS

## 2021-01-14 MED ORDER — INSULIN ASPART 100 UNIT/ML IJ SOLN
0.0000 [IU] | INTRAMUSCULAR | Status: DC
  Administered 2021-01-14: 3 [IU] via SUBCUTANEOUS
  Administered 2021-01-14: 1 [IU] via SUBCUTANEOUS
  Administered 2021-01-15: 3 [IU] via SUBCUTANEOUS
  Administered 2021-01-15 (×2): 1 [IU] via SUBCUTANEOUS
  Administered 2021-01-15: 2 [IU] via SUBCUTANEOUS
  Administered 2021-01-16 – 2021-01-17 (×8): 1 [IU] via SUBCUTANEOUS

## 2021-01-14 MED ORDER — SUCCINYLCHOLINE CHLORIDE 200 MG/10ML IV SOSY
PREFILLED_SYRINGE | INTRAVENOUS | Status: AC
Start: 2021-01-14 — End: 2021-01-15
  Filled 2021-01-14: qty 10

## 2021-01-14 MED ORDER — FENTANYL CITRATE PF 50 MCG/ML IJ SOSY: 100.0000 ug | PREFILLED_SYRINGE | Freq: Once | INTRAMUSCULAR | Status: AC

## 2021-01-14 MED ORDER — SODIUM CHLORIDE 0.9% FLUSH
10.0000 mL | Freq: Two times a day (BID) | INTRAVENOUS | Status: DC
  Administered 2021-01-14 – 2021-01-15 (×3): 10 mL
  Administered 2021-01-16: 40 mL
  Administered 2021-01-17 – 2021-01-19 (×5): 10 mL

## 2021-01-14 MED ORDER — SUCCINYLCHOLINE CHLORIDE 200 MG/10ML IV SOSY
PREFILLED_SYRINGE | INTRAVENOUS | Status: AC
  Filled 2021-01-14: qty 10

## 2021-01-14 MED ORDER — HYDROCORTISONE SOD SUC (PF) 100 MG IJ SOLR
100.0000 mg | Freq: Two times a day (BID) | INTRAMUSCULAR | Status: DC
  Administered 2021-01-14 – 2021-01-18 (×9): 100 mg via INTRAVENOUS
  Filled 2021-01-14 (×9): qty 2

## 2021-01-14 MED ORDER — FENTANYL CITRATE PF 50 MCG/ML IJ SOSY
PREFILLED_SYRINGE | INTRAMUSCULAR | Status: AC
  Administered 2021-01-14: 100 ug via INTRAVENOUS
  Filled 2021-01-14: qty 2

## 2021-01-14 MED ORDER — LACTATED RINGERS IV BOLUS
2000.0000 mL | Freq: Once | INTRAVENOUS | Status: AC
  Administered 2021-01-14: 2000 mL via INTRAVENOUS

## 2021-01-14 MED ORDER — ROCURONIUM BROMIDE 10 MG/ML (PF) SYRINGE
PREFILLED_SYRINGE | INTRAVENOUS | Status: AC
  Administered 2021-01-14: 80 mg
  Filled 2021-01-14: qty 10

## 2021-01-14 MED ORDER — FENTANYL BOLUS VIA INFUSION
50.0000 ug | INTRAVENOUS | Status: DC | PRN
  Administered 2021-01-14 – 2021-01-18 (×3): 50 ug via INTRAVENOUS
  Filled 2021-01-14: qty 50

## 2021-01-14 MED ORDER — ROCURONIUM BROMIDE 10 MG/ML (PF) SYRINGE
PREFILLED_SYRINGE | INTRAVENOUS | Status: AC
  Filled 2021-01-14: qty 10

## 2021-01-14 MED ORDER — NOREPINEPHRINE 4 MG/250ML-% IV SOLN
INTRAVENOUS | Status: AC
  Administered 2021-01-14: 4 mg
  Filled 2021-01-14: qty 250

## 2021-01-14 MED ORDER — SODIUM CHLORIDE 0.9 % IV SOLN
0.0000 ug/kg/min | INTRAVENOUS | Status: DC
  Administered 2021-01-14 – 2021-01-15 (×2): 3 ug/kg/min via INTRAVENOUS
  Administered 2021-01-15: 1.5 ug/kg/min via INTRAVENOUS
  Filled 2021-01-14 (×3): qty 20

## 2021-01-14 MED ORDER — SODIUM CHLORIDE 0.9 % IV SOLN
500.0000 mg | Freq: Once | INTRAVENOUS | Status: AC
  Administered 2021-01-14: 500 mg via INTRAVENOUS
  Filled 2021-01-14: qty 5

## 2021-01-14 MED ORDER — POLYETHYLENE GLYCOL 3350 17 G PO PACK
17.0000 g | PACK | Freq: Every day | ORAL | Status: DC | PRN
  Administered 2021-01-15 – 2021-01-17 (×2): 17 g
  Filled 2021-01-14 (×2): qty 1

## 2021-01-14 MED ORDER — SODIUM CHLORIDE 0.9% FLUSH: 10.0000 mL | INTRAVENOUS | Status: DC | PRN

## 2021-01-14 MED ORDER — ORAL CARE MOUTH RINSE
15.0000 mL | OROMUCOSAL | Status: DC
  Administered 2021-01-14 – 2021-01-19 (×47): 15 mL via OROMUCOSAL

## 2021-01-14 MED ORDER — STERILE WATER FOR INJECTION IV SOLN
INTRAVENOUS | Status: AC
  Filled 2021-01-14 (×2): qty 1000

## 2021-01-14 MED ORDER — CHLORHEXIDINE GLUCONATE CLOTH 2 % EX PADS
6.0000 | MEDICATED_PAD | Freq: Every day | CUTANEOUS | Status: DC
  Administered 2021-01-14 – 2021-01-16 (×3): 6 via TOPICAL

## 2021-01-14 MED ORDER — SODIUM CHLORIDE 0.9 % IV SOLN: INTRAVENOUS | Status: AC

## 2021-01-14 MED ORDER — NOREPINEPHRINE 4 MG/250ML-% IV SOLN
INTRAVENOUS | Status: AC
  Administered 2021-01-14: 4 mg via INTRAVENOUS
  Filled 2021-01-14: qty 250

## 2021-01-14 MED ORDER — SODIUM CHLORIDE 0.9 % IV SOLN
1.0000 g | Freq: Once | INTRAVENOUS | Status: AC
  Administered 2021-01-14: 1 g via INTRAVENOUS
  Filled 2021-01-14: qty 10

## 2021-01-14 MED ORDER — CISATRACURIUM BOLUS VIA INFUSION
0.1000 mg/kg | Freq: Once | INTRAVENOUS | Status: AC
  Administered 2021-01-14: 13 mg via INTRAVENOUS
  Filled 2021-01-14: qty 13

## 2021-01-14 MED ORDER — VASOPRESSIN 20 UNITS/100 ML INFUSION FOR SHOCK
0.0400 [IU]/min | INTRAVENOUS | Status: DC
  Administered 2021-01-14 – 2021-01-15 (×3): 0.04 [IU]/min via INTRAVENOUS
  Filled 2021-01-14 (×4): qty 100

## 2021-01-14 MED ORDER — SUCCINYLCHOLINE CHLORIDE 20 MG/ML IJ SOLN
INTRAMUSCULAR | Status: DC | PRN
  Administered 2021-01-14: 120 mg via INTRAVENOUS

## 2021-01-14 MED ORDER — PANTOPRAZOLE SODIUM 40 MG IV SOLR
40.0000 mg | Freq: Every day | INTRAVENOUS | Status: DC
  Administered 2021-01-14 – 2021-01-18 (×5): 40 mg via INTRAVENOUS
  Filled 2021-01-14 (×5): qty 40

## 2021-01-14 MED ORDER — VECURONIUM BROMIDE 10 MG IV SOLR
10.0000 mg | Freq: Once | INTRAVENOUS | Status: AC
  Administered 2021-01-14: 10 mg via INTRAVENOUS
  Filled 2021-01-14: qty 10

## 2021-01-14 MED ORDER — SODIUM CHLORIDE 0.9 % IV BOLUS (SEPSIS)
1000.0000 mL | Freq: Once | INTRAVENOUS | Status: AC
  Administered 2021-01-14: 1000 mL via INTRAVENOUS

## 2021-01-14 NOTE — H&P (Signed)
NAME:  Brent Horn, MRN:  [id number], DOB:  [birth year], LOS: 0 ADMISSION DATE:  12/31/2020, CONSULTATION DATE:  12/28/2020 REFERRING MD:  Lynelle Doctor, CHIEF COMPLAINT:  hemoptysis   History of Present Illness:  OOH cardiac arrest preceded by hemoptysis Arrived to ER awake with ROSC but worsening respiratory status Intubated and sats remained in 60s.  PCCM consulted for admission.  Pertinent  Medical History  HTN PCKD  Significant Hospital Events: Including procedures, antibiotic start and stop dates in addition to other pertinent events   12/31 admission  Interim History / Subjective:  Admission  Objective   Blood pressure 121/86, pulse (!) 140, resp. rate (!) 22, height 5\' 11"  (1.803 m), weight 129.6 kg, SpO2 94 %.    Vent Mode: PRVC FiO2 (%):  [100 %] 100 % Set Rate:  [18 bmp-28 bmp] 28 bmp Vt Set:  [600 mL] 600 mL PEEP:  [10 cmH20-22 cmH20] 22 cmH20 Plateau Pressure:  [24 cmH20-38 cmH20] 38 cmH20  No intake or output data in the 24 hours ending 12/24/2020 1818 Filed Weights   12/23/2020 1630  Weight: 129.6 kg    Examination: General: grey appearing man agonally breathing on vent HENT: ETT in place with bloody secretions, NGT with bilious output Lungs: rhonci bilaterally, dyssynchronous with vent Cardiovascular: tachycardic, ext warm Abdomen: soft, hypoactive BS Extremities: no edema Neuro: was oriented and nonfocal prior to intubation per EDP Skin: no rashes   Resolved Hospital Problem list   N/a  Assessment & Plan:  ARDS secondary to massive aspiration event, hemoptysis preceding, unclear if from oropharynx or below, too unstable for imaging Shock OOH cardiac arrest presumed due to the aspiration event with ROSC and apparently neurologically fine per EDP Heavy smoker, heavy drinker Accidental carotid cannulation- too unstable for primary repair, removed and pressure applied x 30 min Acute kidney injury Post arrest shock likely related to inflammatory effect  from aspiration of blood products  - Stabilize on vent tonight, may need proning - NMB, ARDSnet pathway, inh EPO - Sedation to BIS <60 - If O2 needs improved in AM will need laryngoscopy and bronchoscopy to try to figure out where this blood originated - Check echo, UDS - CAP coverage fine - Levophed/vasopressin/stress steroids - Guarded prognosis  Updated mother on phone who is actually just friend who helped raise him.  She is going to reach out to his children and get them in contact with nurse for update.  Best Practice (right click and "Reselect all SmartList Selections" daily)   Diet/type: NPO DVT prophylaxis: not indicated GI prophylaxis: PPI Lines: yes and it is still needed Foley:  Yes, and it is still needed Code Status:  full code Last date of multidisciplinary goals of care discussion [pending]  Labs   CBC: Recent Labs  Lab 12/23/2020 1510 12/25/2020 1526 01/11/2021 1704  WBC 21.2*  --   --   NEUTROABS 19.3*  --   --   HGB 19.5* 20.4* 20.4*  HCT 58.1* 60.0* 60.0*  MCV 90.8  --   --   PLT 267  --   --     Basic Metabolic Panel: Recent Labs  Lab 01/06/2021 1510 12/20/2020 1526 12/31/2020 1704 12/15/2020 1714  NA 137 138 139 136  K 3.7 3.6 3.8 3.8  CL 100 103  --  105  CO2 19*  --   --  18*  GLUCOSE 179* 177*  --  100*  BUN 13 16  --  15  CREATININE 1.60* 1.40*  --  2.02*  CALCIUM 9.4  --   --  8.7*   GFR: Estimated Creatinine Clearance: 62 mL/min (A) (by C-G formula based on SCr of 2.02 mg/dL (H)). Recent Labs  Lab 12/21/2020 1437 12/15/2020 1510  WBC  --  21.2*  LATICACIDVEN 4.3*  --     Liver Function Tests: Recent Labs  Lab 01/02/2021 1510 12/27/2020 1714  AST 26 28  ALT 23 22  ALKPHOS 71 71  BILITOT 0.9 1.0  PROT 7.1 7.2  ALBUMIN 3.2* 3.1*   No results for input(s): LIPASE, AMYLASE in the last 168 hours. No results for input(s): AMMONIA in the last 168 hours.  ABG    Component Value Date/Time   PHART 7.111 (LL) 12/15/2020 1704   PCO2ART 70.7  (HH) 12/20/2020 1704   PO2ART 78 (L) 01/10/2021 1704   HCO3 22.1 01/01/2021 1704   TCO2 24 12/24/2020 1704   ACIDBASEDEF 9.0 (H) 12/22/2020 1704   O2SAT 87.0 01/04/2021 1704     Coagulation Profile: Recent Labs  Lab 01/06/2021 1510 12/28/2020 1714  INR 1.1 1.1    Cardiac Enzymes: No results for input(s): CKTOTAL, CKMB, CKMBINDEX, TROPONINI in the last 168 hours.  HbA1C: No results found for: HGBA1C  CBG: No results for input(s): GLUCAP in the last 168 hours.  Review of Systems:   comatose  Past Medical History:  He,  has a past medical history of Arthritis, Chronic back pain, Hypertension, Kidney stone, Polycystic kidney disease, and Renal disorder.   Surgical History:   Past Surgical History:  Procedure Laterality Date   LITHOTRIPSY       Social History:   reports that he has been smoking. He has quit using smokeless tobacco. He reports that he does not drink alcohol and does not use drugs.   Family History:  His family history is not on file.   Allergies No Known Allergies   Home Medications  Prior to Admission medications   Medication Sig Start Date End Date Taking? Authorizing Provider  HYDROcodone-acetaminophen (NORCO/VICODIN) 5-325 MG tablet Take 1-2 tablets by mouth every 6 (six) hours as needed for severe pain. 12/03/19   Wieters, Hallie C, PA-C  predniSONE (DELTASONE) 10 MG tablet Begin with 6 tabs on day 1, 5 tab on day 2, 4 tab on day 3, 3 tab on day 4, 2 tab on day 5, 1 tab on day 6-take with food 12/03/19   Wieters, Hallie C, PA-C  tiZANidine (ZANAFLEX) 4 MG tablet Take 1 tablet (4 mg total) by mouth every 6 (six) hours as needed for muscle spasms. 12/13/19   Bing Neighbors, FNP     Critical care time: 45 minutes not including separately billable procedures

## 2021-01-14 NOTE — Progress Notes (Signed)
Brief note. Severe hypoxemia. Sounds like massive hemoptysis without clear source on CXR. CXR with ARDS. Cannot oxygenate despite PEEP 22/FiO2 1.0/ paralysis. ECMO consult. Try veletri.  Full consult to follow Myrla Halsted MD PCCM

## 2021-01-14 NOTE — Procedures (Signed)
Arterial Catheter Insertion Procedure Note  Brent Horn  [id number]  Mar 18, 1975  Date:12/17/2020  Time:5:32 PM    Provider Performing: Lorin Glass    Procedure: Insertion of Arterial Line (29924) with US guidance (26834)   Indication(s) Blood pressure monitoring and/or need for frequent ABGs  Consent Unable to obtain consent due to emergent nature of procedure.  Anesthesia None   Time Out Verified patient identification, verified procedure, site/side was marked, verified correct patient position, special equipment/implants available, medications/allergies/relevant history reviewed, required imaging and test results available.   Sterile Technique Maximal sterile technique including full sterile barrier drape, hand hygiene, sterile gown, sterile gloves, mask, hair covering, sterile ultrasound probe cover (if used).   Procedure Description Area of catheter insertion was cleaned with chlorhexidine and draped in sterile fashion. With real-time ultrasound guidance an arterial catheter was placed into the right radial artery.  Appropriate arterial tracings confirmed on monitor.     Complications/Tolerance None; patient tolerated the procedure well.   EBL Minimal   Specimen(s) None

## 2021-01-14 NOTE — Progress Notes (Signed)
Patient transported on vent from ED to 2H22 without complications.

## 2021-01-14 NOTE — Progress Notes (Signed)
Critical ABG results given to MD. Order to increase RR to 28.

## 2021-01-14 NOTE — Progress Notes (Addendum)
eLink Physician-Brief Progress Note Patient Name: Brent Horn DOB: [birth year] MRN: [id number]   Date of Service  12/16/2020  HPI/Events of Note  Obtained blood gas at 2018. Current vent setting are 100% FiO2, 28 RR, 20 Peep, VT 600. Bedside RN is concerned about the bicarb on the blood gas as well. Has bicarb gtt going at 100 currently.   eICU Interventions  Discussed with RT.  Decreased PEEP from 22 to 16, fio2 to 60% from 100 ( pao2 [id number]). Acidosis improving, pH 7.22 on bicarb at 100 ml/hr.   Get ABG in an hour. If acidosis improving would decrease bicarb gtt. Earlier [name] eval done. ETOH/smoker-hemoptysis/ARDS. AKI.      Intervention Category Intermediate Interventions: Other: (ABG/Vent)  [name] 12/28/2020, 8:50 PM  22:40 7.23/39/120/18 Vent settings 60%  FiO2, 28 RR, 16 Peep, 600 TV. Lactic acid trending up to 4.2. No urinary output. K ok.  AHRF. ARDS. Asp pneumonia. Acute on CKD. On bicarb gtt. Hemoptysis hx. No active bleeding. Hg 16. Covid/flu neg.  Discussed with RN. Vaso/Levophed 15. CVP 16. Just turning him slight turn desaturates.  Would defer CTA for AM. Even if has PE- can not anti coagulate. DIC panel pending. Will look into d dimer. Reason for bleeding- hemoptysis vs hematemesis unknown yet.

## 2021-01-14 NOTE — Progress Notes (Signed)
Pharmacy consult  Inhaled epoprostenol (VELETRI) Monitoring:  Current dose: 50 ng/kg/min and 7.53 mL/hr IBW: 75.3 kg Duration of each syringe: 6.6 hr Last syringe hung (date/time): initial syringe hung at 18:00 12/31  Next syringe due (date/time): 00:40 and 01/15/21 Backup on unit: Yes  Current plan:   Initiation  at 50 ng/kg/min  Sheppard Coil PharmD., BCPS Clinical Pharmacist 12/22/2020 5:04 PM

## 2021-01-14 NOTE — ED Provider Notes (Signed)
[name] EMERGENCY DEPARTMENT Provider Note   CSN: [id number] Arrival date & time: 01/04/2021  1437     History Chief complaint respiratory distress  Brent Horn is a 45 y.o. male.  HPI  Patient presented to the ED after have an episode of unresponsiveness and respiratory distress.  According to the EMS report patient became unresponsive after having gross hemoptysis.  Bystander CPR was performed.  When EMS arrived patient was alert but in respiratory distress.  EMS administered 100% nonrebreather.  Patient continued to have gross hemoptysis as well as respiratory distress and hypoxia.  They started him on CPAP sats still remained best in the 80s.  Patient on arrival denies any medical problems.  Admits to feeling very short of breath.  He is a smoker.    Past Medical History:  Diagnosis Date   Arthritis    Chronic back pain    Hypertension    Kidney stone    Polycystic kidney disease    Renal disorder     Patient Active Problem List   Diagnosis Date Noted   Cardiac arrest (HCC) 01/12/2021    Past Surgical History:  Procedure Laterality Date   LITHOTRIPSY         No family history on file.  Social History   Tobacco Use   Smoking status: Some Days   Smokeless tobacco: Former  Building services engineer Use: Never used  Substance Use Topics   Alcohol use: No   Drug use: No    Home Medications Prior to Admission medications   Medication Sig Start Date End Date Taking? Authorizing Provider  HYDROcodone-acetaminophen (NORCO/VICODIN) 5-325 MG tablet Take 1-2 tablets by mouth every 6 (six) hours as needed for severe pain. 12/03/19   Wieters, Hallie C, PA-C  predniSONE (DELTASONE) 10 MG tablet Begin with 6 tabs on day 1, 5 tab on day 2, 4 tab on day 3, 3 tab on day 4, 2 tab on day 5, 1 tab on day 6-take with food 12/03/19   Wieters, Hallie C, PA-C  tiZANidine (ZANAFLEX) 4 MG tablet Take 1 tablet (4 mg total) by mouth every 6 (six) hours as needed  for muscle spasms. 12/13/19   Bing Neighbors, FNP    Allergies    Patient has no known allergies.  Review of Systems   Review of Systems  All other systems reviewed and are negative.  Physical Exam Updated Vital Signs BP 121/86    Pulse (!) 140    Resp (!) 22    Ht 1.803 m (5\' 11" )    Wt 129.6 kg    SpO2 (!) 85%    BMI 39.85 kg/m   Physical Exam Vitals and nursing note reviewed.  Constitutional:      General: He is in acute distress.     Appearance: He is well-developed. He is ill-appearing.  HENT:     Head: Normocephalic and atraumatic.     Right Ear: External ear normal.     Left Ear: External ear normal.  Eyes:     General: No scleral icterus.       Right eye: No discharge.        Left eye: No discharge.     Conjunctiva/sclera: Conjunctivae normal.  Neck:     Trachea: No tracheal deviation.  Cardiovascular:     Rate and Rhythm: Normal rate and regular rhythm.  Pulmonary:     Effort: Accessory muscle usage and respiratory distress present.  Breath sounds: Normal breath sounds. No stridor. No wheezing or rales.     Comments: Coughing up gross blood Abdominal:     General: Bowel sounds are normal. There is no distension.     Palpations: Abdomen is soft.     Tenderness: There is no abdominal tenderness. There is no guarding or rebound.  Musculoskeletal:        General: No tenderness or deformity.     Cervical back: Neck supple.  Skin:    General: Skin is warm and dry.     Findings: No rash.  Neurological:     General: No focal deficit present.     Mental Status: He is alert.     Cranial Nerves: No cranial nerve deficit (no facial droop, extraocular movements intact, no slurred speech).     Sensory: No sensory deficit.     Motor: No abnormal muscle tone or seizure activity.     Coordination: Coordination normal.  Psychiatric:        Mood and Affect: Mood normal.    ED Results / Procedures / Treatments   Labs (all labs ordered are listed, but only  abnormal results are displayed) Labs Reviewed  COMPREHENSIVE METABOLIC PANEL - Abnormal; Notable for the following components:      Result Value   CO2 19 (*)    Glucose, Bld 179 (*)    Creatinine, Ser 1.60 (*)    Albumin 3.2 (*)    GFR, Estimated 53 (*)    Anion gap 18 (*)    All other components within normal limits  CBC WITH DIFFERENTIAL/PLATELET - Abnormal; Notable for the following components:   WBC 21.2 (*)    RBC 6.40 (*)    Hemoglobin 19.5 (*)    HCT 58.1 (*)    Neutro Abs 19.3 (*)    Abs Immature Granulocytes 0.13 (*)    All other components within normal limits  I-STAT CHEM 8, ED - Abnormal; Notable for the following components:   Creatinine, Ser 1.40 (*)    Glucose, Bld 177 (*)    Calcium, Ion 1.10 (*)    Hemoglobin 20.4 (*)    HCT 60.0 (*)    All other components within normal limits  POCT I-STAT 7, (LYTES, BLD GAS, ICA,H+H) - Abnormal; Notable for the following components:   pH, Arterial 7.111 (*)    pCO2 arterial 70.7 (*)    pO2, Arterial 78 (*)    Acid-base deficit 9.0 (*)    HCT 60.0 (*)    Hemoglobin 20.4 (*)    All other components within normal limits  RESP PANEL BY RT-PCR (FLU A&B, COVID) ARPGX2  CULTURE, BLOOD (ROUTINE X 2)  CULTURE, BLOOD (ROUTINE X 2)  URINE CULTURE  PROTIME-INR  APTT  LACTIC ACID, PLASMA  LACTIC ACID, PLASMA  URINALYSIS, ROUTINE W REFLEX MICROSCOPIC  BRAIN NATRIURETIC PEPTIDE  HIV ANTIBODY (ROUTINE TESTING W REFLEX)  BLOOD GAS, ARTERIAL  COMPREHENSIVE METABOLIC PANEL  PROTIME-INR  DIC (DISSEMINATED INTRAVASCULAR COAGULATION)PANEL  TRIGLYCERIDES  CBC  MAGNESIUM  PHOSPHORUS  I-STAT ARTERIAL BLOOD GAS, ED  TYPE AND SCREEN  ABO/RH  TROPONIN I (HIGH SENSITIVITY)  TROPONIN I (HIGH SENSITIVITY)    EKG None  Radiology DG Chest Portable 1 View  Result Date: 01-29-21 CLINICAL DATA:  Patient presents from home unresponsive. EXAM: PORTABLE CHEST 1 VIEW COMPARISON:  10/06/2016 FINDINGS: Bilateral hazy airspace lung  opacities are the central prominence. No convincing pleural effusion or pneumothorax on the semi-erect study. Cardiac silhouette normal in size.  No mediastinal or hilar masses. Endotracheal tube tip projects 3.4 cm above the Carina. Nasal/orogastric tube passes well below the diaphragm, into the stomach and below the included field of view. IMPRESSION: 1. Hazy bilateral airspace lung opacities, pattern suspected to be pulmonary edema, although multifocal pneumonia is also possible. 2. Well-positioned endotracheal tube and nasal/orogastric tube. Electronically Signed   By: Amie Portland M.D.   On: 01-30-21 15:58    Procedures Procedure Name: Intubation Date/Time: 01/30/2021 3:08 PM Performed by: Linwood Dibbles, MD Pre-anesthesia Checklist: Patient identified, Patient being monitored, Emergency Drugs available, Timeout performed and Suction available Oxygen Delivery Method: Non-rebreather mask Preoxygenation: Pre-oxygenation with 100% oxygen Induction Type: Rapid sequence Ventilation: Mask ventilation without difficulty Number of attempts: 1 Airway Equipment and Method: Video-laryngoscopy Placement Confirmation: ETT inserted through vocal cords under direct vision, CO2 detector and Breath sounds checked- equal and bilateral Comments: Intubated without difficulty.  Significant blood noted on the airway    .Critical Care Performed by: Linwood Dibbles, MD Authorized by: Linwood Dibbles, MD   Critical care provider statement:    Critical care time (minutes):  50   Critical care was time spent personally by me on the following activities:  Development of treatment plan with patient or surrogate, discussions with consultants, evaluation of patient's response to treatment, examination of patient, ordering and review of laboratory studies, ordering and review of radiographic studies, ordering and performing treatments and interventions, pulse oximetry, re-evaluation of patient's condition and review of old charts    Medications Ordered in ED Medications  succinylcholine (ANECTINE) 200 MG/10ML syringe (has no administration in time range)  etomidate (AMIDATE) 2 MG/ML injection (has no administration in time range)  etomidate (AMIDATE) injection (30 mg Intravenous Given 01-30-21 1446)  succinylcholine (ANECTINE) injection (120 mg Intravenous Given 01/30/2021 1447)  fentaNYL (SUBLIMAZE) injection 50 mcg (has no administration in time range)  fentaNYL in NS (73mcg/ml) infusion-PREMIX (50 mcg/hr Intravenous New Bag/Given 01-30-21 1501)  fentaNYL (SUBLIMAZE) bolus via infusion 50 mcg (50 mcg Intravenous Bolus from Bag 01-30-2021 1532)  0.9 %  sodium chloride infusion ( Intravenous New Bag/Given 01/30/21 1703)  sodium chloride 0.9 % bolus 1,000 mL (1,000 mLs Intravenous New Bag/Given 01/30/21 1704)  cefTRIAXone (ROCEPHIN) 1 g in sodium chloride 0.9 % 100 mL IVPB (has no administration in time range)  azithromycin (ZITHROMAX) 500 mg in sodium chloride 0.9 % 250 mL IVPB (has no administration in time range)  polyethylene glycol (MIRALAX / GLYCOLAX) packet 17 g (has no administration in time range)  epoprostenol (VELETRI) for inhalation 1.5mg /44mL (30,000 ng/mL) (has no administration in time range)  docusate (COLACE) 50 MG/5ML liquid 100 mg (has no administration in time range)  norepinephrine (LEVOPHED) 4mg  in (0.016 mg/mL) premix infusion (4 mg Intravenous New Bag/Given January 30, 2021 1704)  propofol (DIPRIVAN) 1000 MG/100ML infusion (20 mcg/kg/min  129.6 kg Intravenous IV Pump [employer] 01/30/2021 1630)  fentaNYL (SUBLIMAZE) injection 100 mcg (100 mcg Intravenous Given January 30, 2021 1457)  vecuronium (NORCURON) injection 10 mg (10 mg Intravenous Given 2021/01/30 1511)  rocuronium bromide 100 MG/10ML SOSY (80 mg  Given 01/30/2021 1649)    ED Course  I have reviewed the triage vital signs and the nursing notes.  Pertinent labs & imaging results that were available during my care of the patient were  reviewed by me and considered in my medical decision making (see chart for details).  Clinical Course as of 01-30-21 1710  Sat 01-30-2021  1502 Oxygen saturation remain low.  Patient coughing and working against  the vent.  Sedation is increased.  Will give dose of vecuronium [JK]  1548 I-STAT Chem-8 shows slightly elevated creatinine and increased hemoglobin [JK]  1549 Chest x-ray independently reviewed.  ET tube appears to be in appropriate location.  Patient has bilateral infiltrates [JK]    Clinical Course User Index [JK] Linwood Dibbles, MD   MDM Rules/Calculators/A&P                         Patient presented to the ED for severe respiratory distress.  Patient had an episode of hemoptysis prior to his syncopal event.  Bystander CPR was performed.  On initial arrival in the ED the patient was alert and awake and answering questions appropriately.  However he was on BiPAP and had oxygen saturations in the 80s in significant respiratory distress.  He continued to have hemoptysis.  Broad-spectrum antibiotics were started.  Patient was intubated because of his hypoxia.  Despite intubation but continued to have issues with persistent hypoxia.  Patient was sedated.  He was also paralyzed ventilator settings were adjusted with increasing PEEP to try to facilitate oxygenation.  Patient's x-ray showed bilateral infiltrates.  Started on broad-spectrum antibiotics.  Critical care was consulted.  Patient was admitted to the ICU for further treatment.    Final Clinical Impression(s) / ED Diagnoses Hypoxemic respiratory failure, multifocal pneumonia, hemoptysis   Linwood Dibbles, MD 01/05/2021 [phone]

## 2021-01-14 NOTE — ED Triage Notes (Addendum)
Pt here from home d/t unresponsive. Pt became unresponsive . Began coughing up blood. Friends started CPR. Pt arrived talking and coughing up blood.

## 2021-01-14 NOTE — ED Notes (Signed)
A-Line placed Rt wrist by Katrinka Blazing MD.

## 2021-01-14 NOTE — Progress Notes (Signed)
CXR reviewed and looked at waveform on CVP for left neck line: think it went through and through IJ into carotid. Discussed with [facility] on call Dr. Chestine Spore, given severity of illness will remove and hold pressure x 30 mins.  Can re-eval if any bleeding issues.   Myrla Halsted MD PCCM

## 2021-01-14 NOTE — Progress Notes (Signed)
1630 - Pt arrived from ED w/bedside report given.  All questions answered.  All lines and drips verified.  Safety checks performed. Hand hygiene performed before/after each pt contact. Pt became agitated upon moving to 2H bed.  Sedation increased. Pt placed on 2H22 monitor.  A-line zeroed. CHG bath performed.  Mepilex placed on pt's sacral area. Mepilexes placed on pt's feet, knees, areolas, and around pt's ETT in preparation for proning pt. 1714 - Lab called.  Lactic Acid 4.3.  Dr. Katrinka Blazing notified.  Redraw was 2.4. 1722 - CXR showed CVC placed in ED malpositioned. 1750 - CVC line D/C'd.  Pressure held for 30 min, per Dr. Katrinka Blazing. No noted hemorrhage.  Site bandaged. 1800 - CVC placed in R fem.  Pt's SpO2 increased and so proning delayed. 1845 - Lab called.  High Sensitivity Troponin increased from 15 to 67.  Dr. Katrinka Blazing notified. 1900 - Report given to PM RN.  All questions answered.

## 2021-01-14 NOTE — Procedures (Signed)
Central Venous Catheter Insertion Procedure Note  Brent Horn  [id number]  04-05-1975  Date:01/11/2021  Time:6:17 PM   Provider Performing:Adithya Difrancesco Erby Pian   Procedure: Insertion of Non-tunneled Central Venous Catheter(36556) with US guidance (94854)   Indication(s) Medication administration  Consent Unable to obtain consent due to emergent nature of procedure.  Anesthesia Topical only with 1% lidocaine   Timeout Verified patient identification, verified procedure, site/side was marked, verified correct patient position, special equipment/implants available, medications/allergies/relevant history reviewed, required imaging and test results available.  Sterile Technique Maximal sterile technique including full sterile barrier drape, hand hygiene, sterile gown, sterile gloves, mask, hair covering, sterile ultrasound probe cover (if used).  Procedure Description Area of catheter insertion was cleaned with chlorhexidine and draped in sterile fashion.  With real-time ultrasound guidance a central venous catheter was placed into the right femoral vein. Nonpulsatile blood flow and easy flushing noted in all ports.  The catheter was sutured in place and sterile dressing applied.  Complications/Tolerance None; patient tolerated the procedure well. Chest X-ray is ordered to verify placement for internal jugular or subclavian cannulation.   Chest x-ray is not ordered for femoral cannulation.  EBL Minimal  Specimen(s) None

## 2021-01-14 NOTE — ED Notes (Signed)
Central Line placed by Katrinka Blazing MD

## 2021-01-14 NOTE — Procedures (Signed)
Central Venous Catheter Insertion Procedure Note  Aster Eckrich  [id number]  [birth year]  Date:12/17/2020  Time:5:28 PM   Provider Performing:Berea Majkowski Erby Pian   Procedure: Insertion of Non-tunneled Central Venous [phone]) with US guidance (18841)   Indication(s) Difficult access  Consent Risks of the procedure as well as the alternatives and risks of each were explained to the patient and/or caregiver.  Consent for the procedure was obtained and is signed in the bedside chart  Anesthesia Topical only with 1% lidocaine   Timeout Verified patient identification, verified procedure, site/side was marked, verified correct patient position, special equipment/implants available, medications/allergies/relevant history reviewed, required imaging and test results available.  Sterile Technique Maximal sterile technique including full sterile barrier drape, hand hygiene, sterile gown, sterile gloves, mask, hair covering, sterile ultrasound probe cover (if used).  Procedure Description Area of catheter insertion was cleaned with chlorhexidine and draped in sterile fashion.  With real-time ultrasound guidance a central venous catheter was placed into the left internal jugular vein. Nonpulsatile blood flow and easy flushing noted in all ports.  The catheter was sutured in place and sterile dressing applied.  Complications/Tolerance None; patient tolerated the procedure well. Chest X-ray is ordered to verify placement for internal jugular or subclavian cannulation.   Chest x-ray is not ordered for femoral cannulation.  EBL Minimal  Specimen(s) None

## 2021-01-15 ENCOUNTER — Inpatient Hospital Stay (HOSPITAL_COMMUNITY): Payer: Medicaid Other

## 2021-01-15 ENCOUNTER — Other Ambulatory Visit (HOSPITAL_COMMUNITY)

## 2021-01-15 DIAGNOSIS — I7779 Dissection of other artery: Secondary | ICD-10-CM

## 2021-01-15 DIAGNOSIS — I469 Cardiac arrest, cause unspecified: Secondary | ICD-10-CM | POA: Diagnosis not present

## 2021-01-15 DIAGNOSIS — R609 Edema, unspecified: Secondary | ICD-10-CM

## 2021-01-15 LAB — COMPREHENSIVE METABOLIC PANEL
ALT: 15 U/L (ref 0–44)
AST: 21 U/L (ref 15–41)
Albumin: 2 g/dL — ABNORMAL LOW (ref 3.5–5.0)
Alkaline Phosphatase: 43 U/L (ref 38–126)
Anion gap: 9 (ref 5–15)
BUN: 17 mg/dL (ref 6–20)
CO2: 18 mmol/L — ABNORMAL LOW (ref 22–32)
Calcium: 7.3 mg/dL — ABNORMAL LOW (ref 8.9–10.3)
Chloride: 103 mmol/L (ref 98–111)
Creatinine, Ser: 1.96 mg/dL — ABNORMAL HIGH (ref 0.61–1.24)
GFR, Estimated: 42 mL/min — ABNORMAL LOW (ref >60)
Glucose, Bld: 211 mg/dL — ABNORMAL HIGH (ref 70–99)
Potassium: 4 mmol/L (ref 3.5–5.1)
Sodium: 130 mmol/L — ABNORMAL LOW (ref 135–145)
Total Bilirubin: 0.6 mg/dL (ref 0.3–1.2)
Total Protein: 4.9 g/dL — ABNORMAL LOW (ref 6.5–8.1)

## 2021-01-15 LAB — ECHOCARDIOGRAM COMPLETE
Area-P 1/2: 2.95 cm2
Calc EF: 40.2 %
Height: 71 in
S' Lateral: 3.6 cm
Single Plane A2C EF: 38 %
Single Plane A4C EF: 44.5 %
Weight: 4493.86 oz

## 2021-01-15 LAB — SODIUM
Sodium: 133 mmol/L — ABNORMAL LOW (ref 135–145)
Sodium: 135 mmol/L (ref 135–145)

## 2021-01-15 LAB — POCT I-STAT 7, (LYTES, BLD GAS, ICA,H+H)
Acid-Base Excess: 0 mmol/L (ref 0.0–2.0)
Acid-base deficit: 6 mmol/L — ABNORMAL HIGH (ref 0.0–2.0)
Acid-base deficit: 1 mmol/L (ref 0.0–2.0)
Bicarbonate: 18.5 mmol/L — ABNORMAL LOW (ref 20.0–28.0)
Bicarbonate: 23.7 mmol/L (ref 20.0–28.0)
Bicarbonate: 26 mmol/L (ref 20.0–28.0)
Calcium, Ion: 1.1 mmol/L — ABNORMAL LOW (ref 1.15–1.40)
Calcium, Ion: 1.09 mmol/L — ABNORMAL LOW (ref 1.15–1.40)
Calcium, Ion: 1.11 mmol/L — ABNORMAL LOW (ref 1.15–1.40)
HCT: 44 % (ref 39.0–52.0)
HCT: 38 % — ABNORMAL LOW (ref 39.0–52.0)
HCT: 36 % — ABNORMAL LOW (ref 39.0–52.0)
Hemoglobin: 15 g/dL (ref 13.0–17.0)
Hemoglobin: 12.9 g/dL — ABNORMAL LOW (ref 13.0–17.0)
Hemoglobin: 12.2 g/dL — ABNORMAL LOW (ref 13.0–17.0)
O2 Saturation: 99 %
O2 Saturation: 96 %
O2 Saturation: 100 %
Patient temperature: 37.3
Patient temperature: 36.7
Patient temperature: 36.9
Potassium: 4.1 mmol/L (ref 3.5–5.1)
Potassium: 3.8 mmol/L (ref 3.5–5.1)
Potassium: 3.8 mmol/L (ref 3.5–5.1)
Sodium: 134 mmol/L — ABNORMAL LOW (ref 135–145)
Sodium: 135 mmol/L (ref 135–145)
Sodium: 138 mmol/L (ref 135–145)
TCO2: 19 mmol/L — ABNORMAL LOW (ref 22–32)
TCO2: 25 mmol/L (ref 22–32)
TCO2: 27 mmol/L (ref 22–32)
pCO2 arterial: 33.6 mmHg (ref 32.0–48.0)
pCO2 arterial: 36.7 mmHg (ref 32.0–48.0)
pCO2 arterial: 45.8 mmHg (ref 32.0–48.0)
pH, Arterial: 7.35 (ref 7.350–7.450)
pH, Arterial: 7.417 (ref 7.350–7.450)
pH, Arterial: 7.363 (ref 7.350–7.450)
pO2, Arterial: 122 mmHg — ABNORMAL HIGH (ref 83.0–108.0)
pO2, Arterial: 77 mmHg — ABNORMAL LOW (ref 83.0–108.0)
pO2, Arterial: 200 mmHg — ABNORMAL HIGH (ref 83.0–108.0)

## 2021-01-15 LAB — MAGNESIUM
Magnesium: 1.3 mg/dL — ABNORMAL LOW (ref 1.7–2.4)
Magnesium: 2.6 mg/dL — ABNORMAL HIGH (ref 1.7–2.4)

## 2021-01-15 LAB — CBC
HCT: 45.3 % (ref 39.0–52.0)
Hemoglobin: 15.4 g/dL (ref 13.0–17.0)
MCH: 30.3 pg (ref 26.0–34.0)
MCHC: 34 g/dL (ref 30.0–36.0)
MCV: 89.2 fL (ref 80.0–100.0)
Platelets: 203 10*3/uL (ref 150–400)
RBC: 5.08 MIL/uL (ref 4.22–5.81)
RDW: 13.5 % (ref 11.5–15.5)
WBC: 21.9 10*3/uL — ABNORMAL HIGH (ref 4.0–10.5)
nRBC: 0 % (ref 0.0–0.2)

## 2021-01-15 LAB — GLUCOSE, CAPILLARY
Glucose-Capillary: 210 mg/dL — ABNORMAL HIGH (ref 70–99)
Glucose-Capillary: 117 mg/dL — ABNORMAL HIGH (ref 70–99)
Glucose-Capillary: 159 mg/dL — ABNORMAL HIGH (ref 70–99)
Glucose-Capillary: 90 mg/dL (ref 70–99)
Glucose-Capillary: 123 mg/dL — ABNORMAL HIGH (ref 70–99)

## 2021-01-15 LAB — TRIGLYCERIDES: Triglycerides: 103 mg/dL (ref <150)

## 2021-01-15 LAB — ABO/RH: ABO/RH(D): O POS

## 2021-01-15 LAB — PHOSPHORUS: Phosphorus: 3.5 mg/dL (ref 2.5–4.6)

## 2021-01-15 IMAGING — DX DG ABDOMEN 1V
3 series · 3 of 3 positions shown · non-contrast
Comparison: Abdominal x-ray [DATE].

CLINICAL DATA: Screen for metal prior to MRI.

EXAM:
ABDOMEN - 1 VIEW

[abdomen supine (1 of 3)]
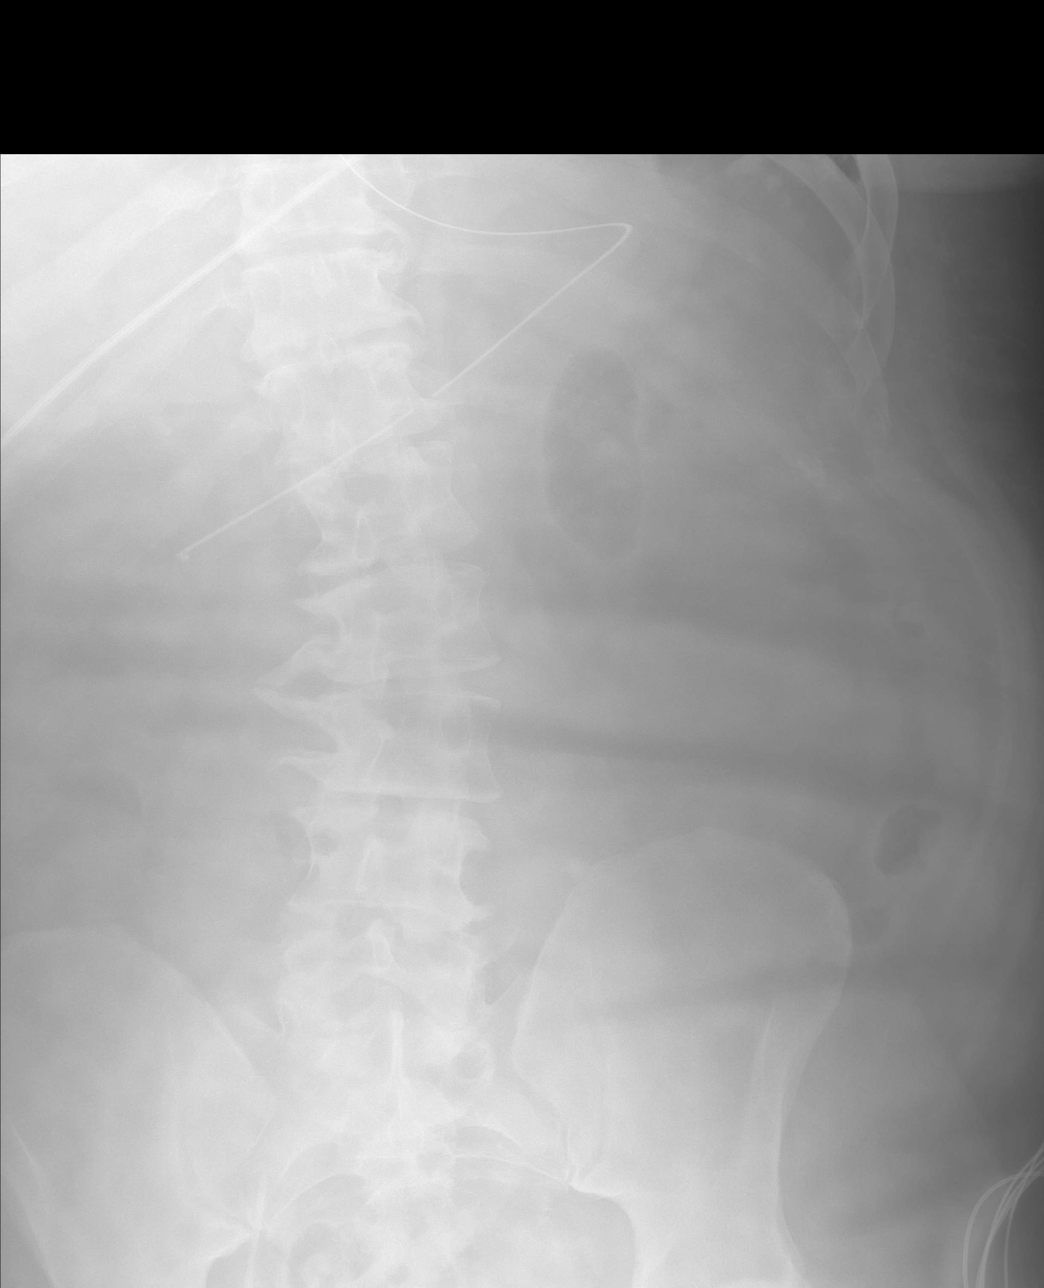

[abdomen supine (2 of 3)]
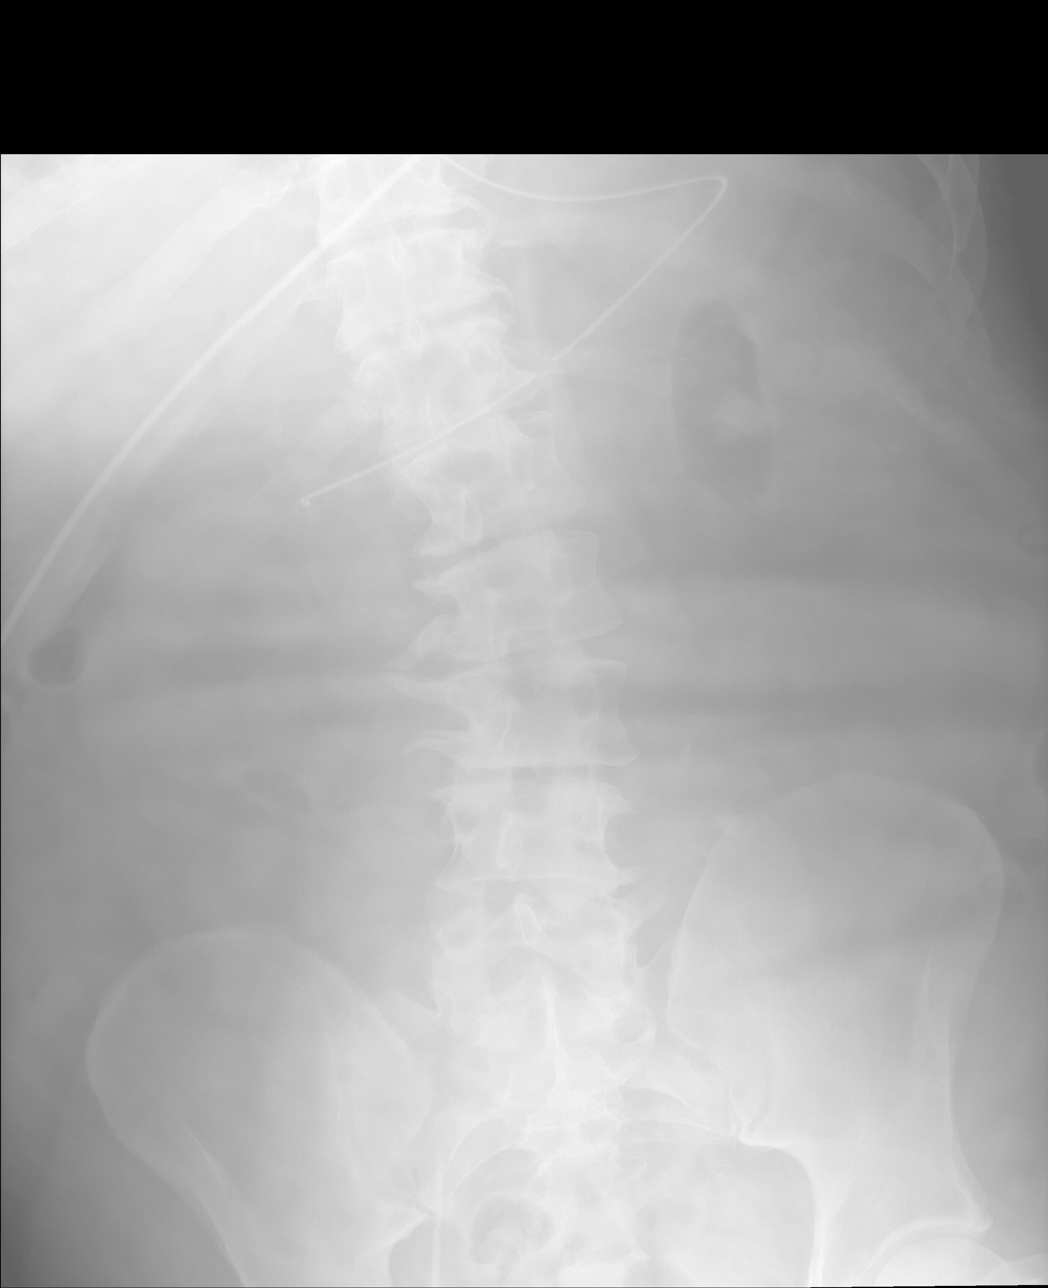

[abdomen supine (3 of 3)]
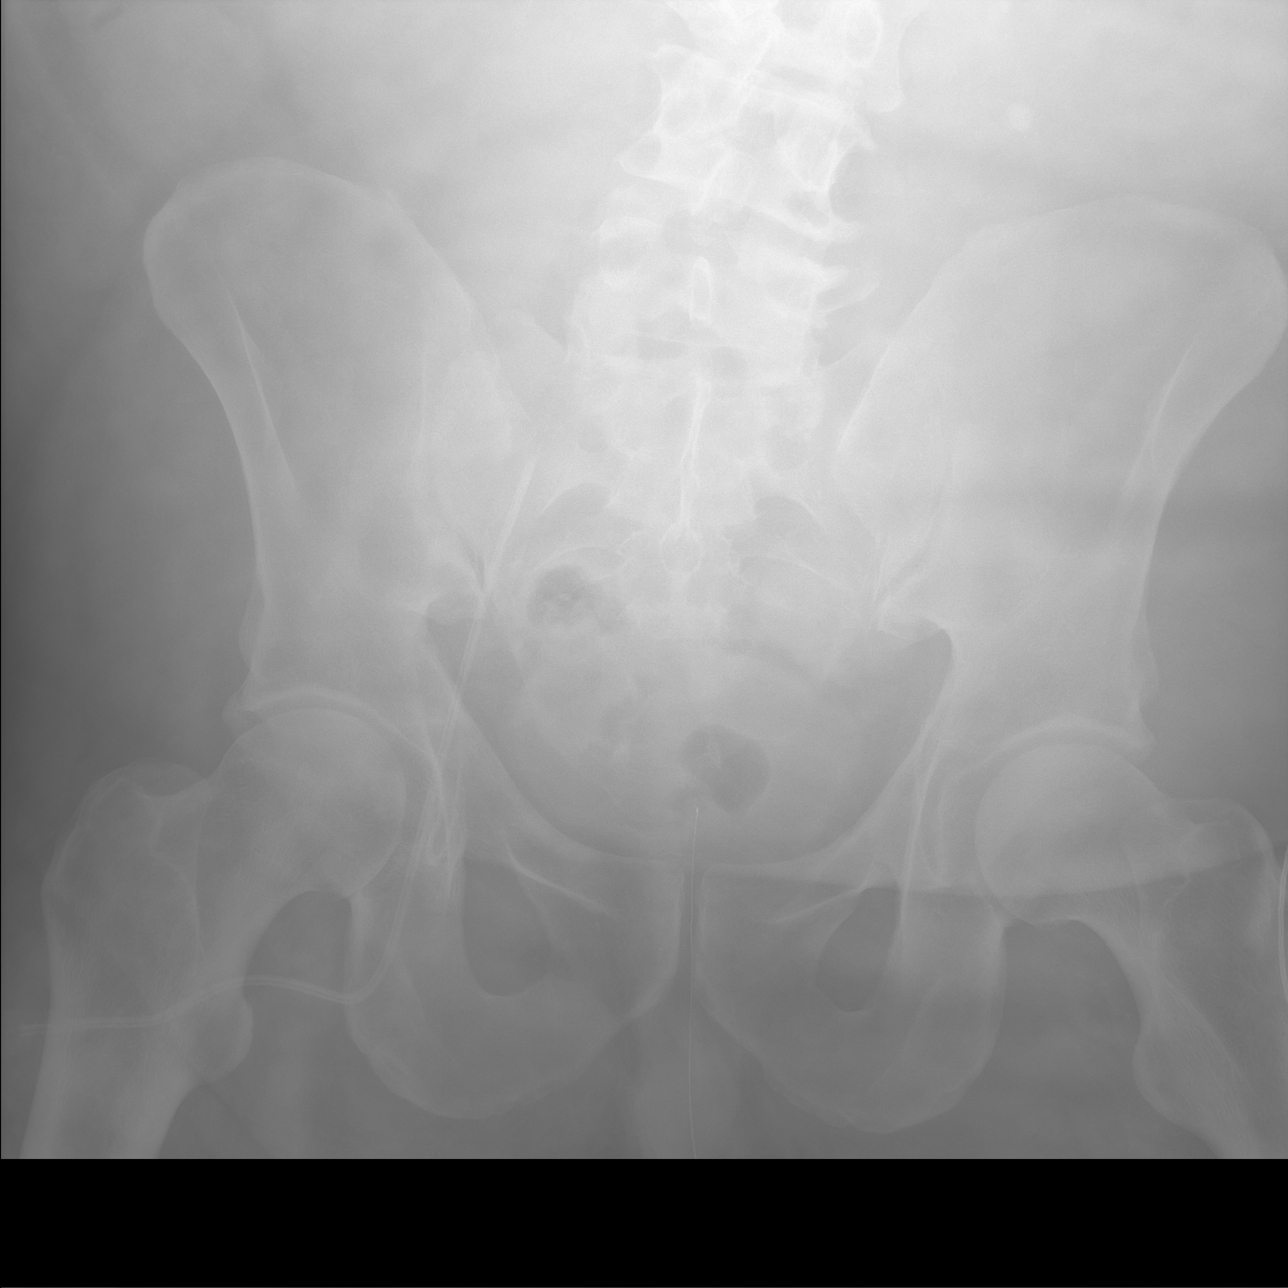

[3 of 3 positions shown; findings below may reference images not displayed]

FINDINGS: Enteric tube tip projects over the distal stomach. Bowel-gas pattern
is nonobstructive.

Right femoral central venous catheter tip projects over the right
pelvis. Rectal catheter present.

No other radiopaque foreign bodies are identified. There is a 6 mm
rounded calcification projecting over the left abdomen.
IMPRESSION: 1. No unexpected radiopaque foreign body.
2. Enteric tube, right femoral catheter and rectal catheter in
place.
3. 6 mm calcification in the left abdomen, indeterminate. Urinary
tract calculus can not be excluded.

## 2021-01-15 IMAGING — CT CT NECK W/O CM
4 of 5 series · 14 of 34 positions shown, 16 images · IV contrast (APPLIED)
Comparison: None.

CLINICAL DATA: Soft tissue swelling.

EXAM:
CT NECK WITHOUT CONTRAST
TECHNIQUE: Multidetector CT imaging of the neck was performed following the
standard protocol without intravenous contrast.

[Series 4: axial bone · axial · 0.53mm/px · z∈[-472,-360]mm · 3 of 112 slices shown, 4 images]
[im 28/112  soft-tissue]
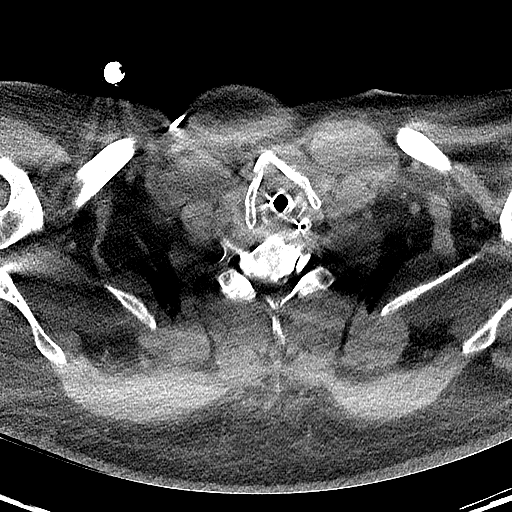
[im 28/112  bone]
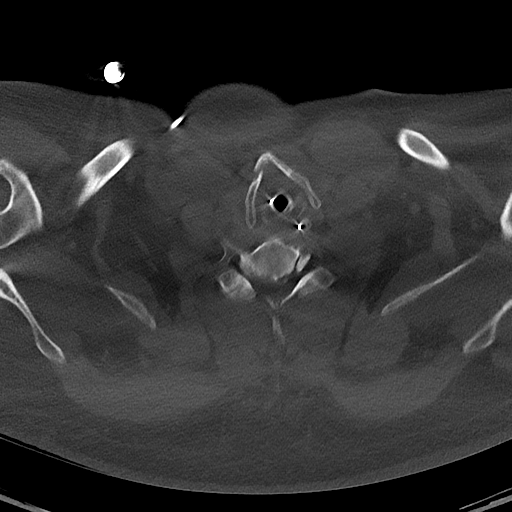
[im 56/112  bone]
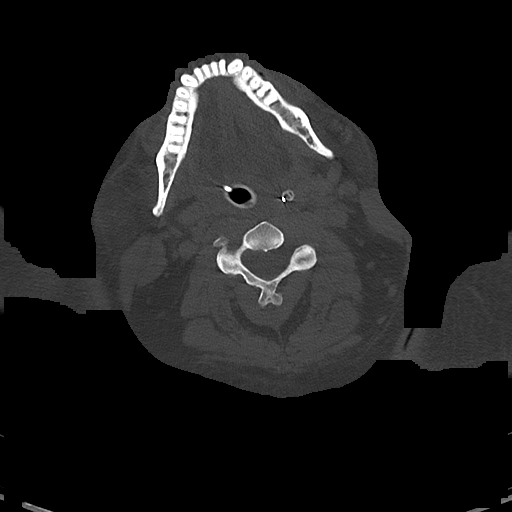
[im 84/112  bone]
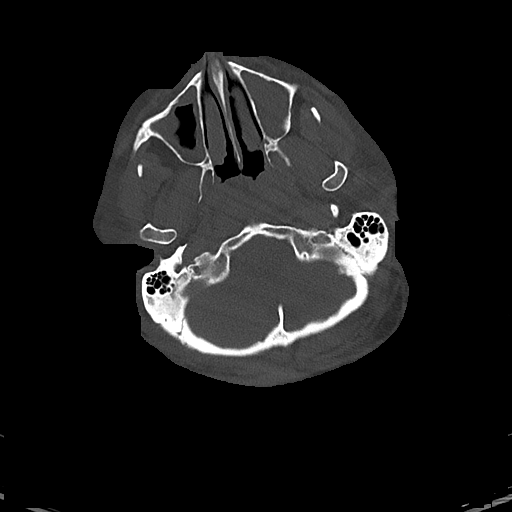

[Series 5: ax oropharynx · axial · 0.59mm/px · z∈[-492,-372]mm · 3 of 121 slices shown]
[im 31/121  bone]
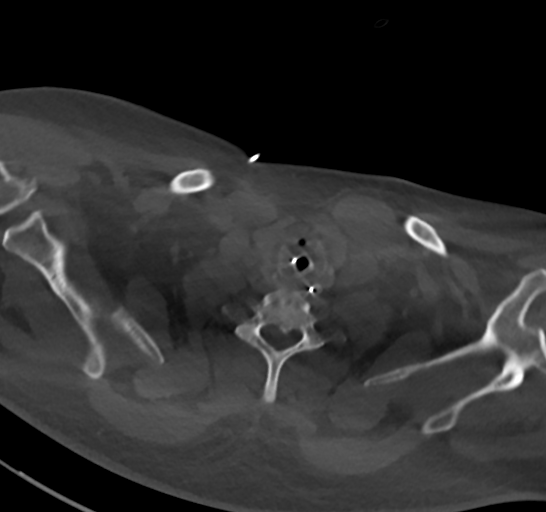
[im 61/121  bone]
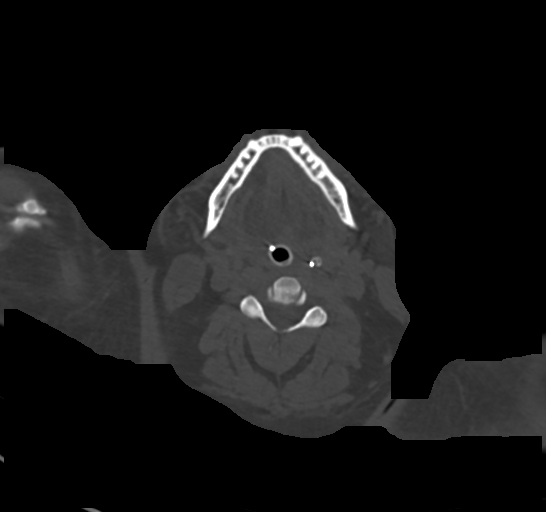
[im 91/121  bone]
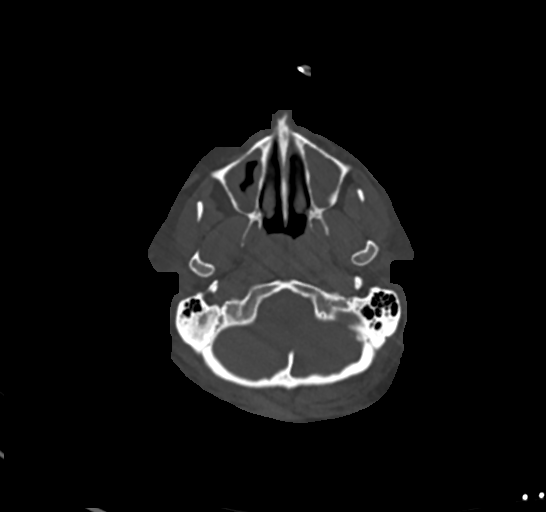

[Series 6: sag neck · sagittal · 0.47mm/px · 5 of 143 slices shown, 6 images]
[im 48/143  bone]
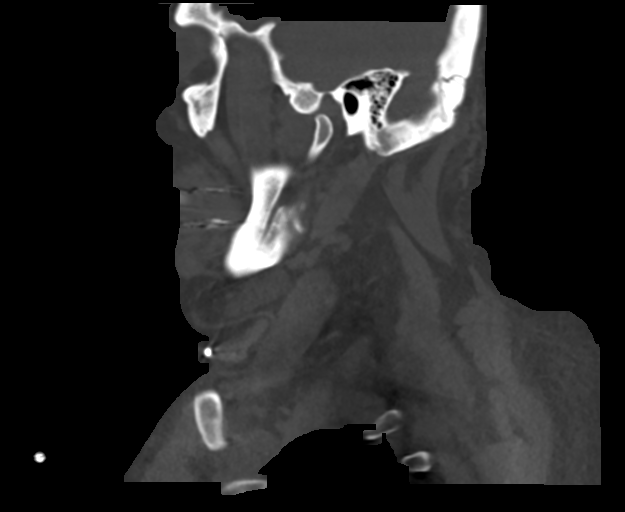
[im 60/143  bone]
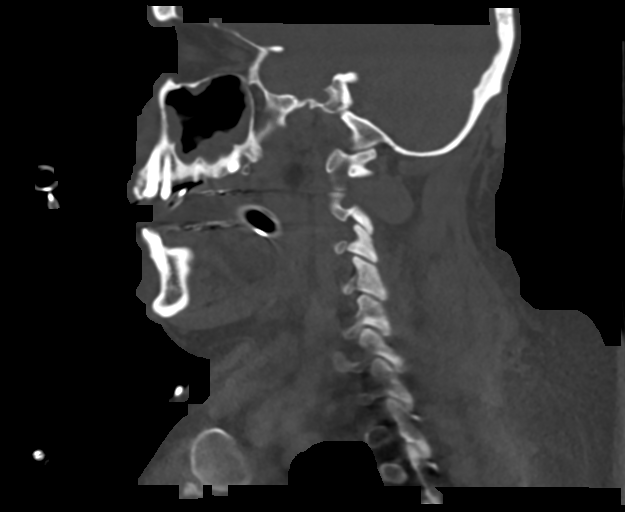
[im 72/143  soft-tissue]
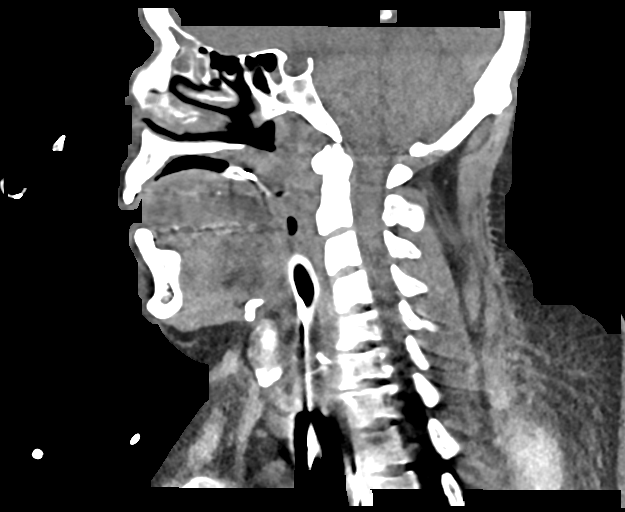
[im 72/143  bone]
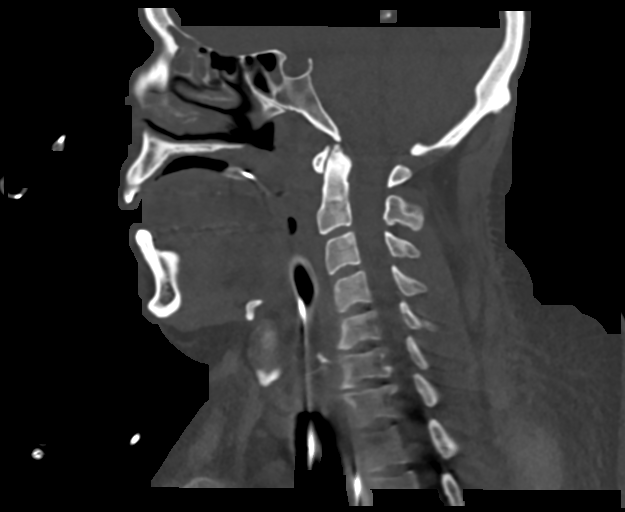
[im 83/143  bone]
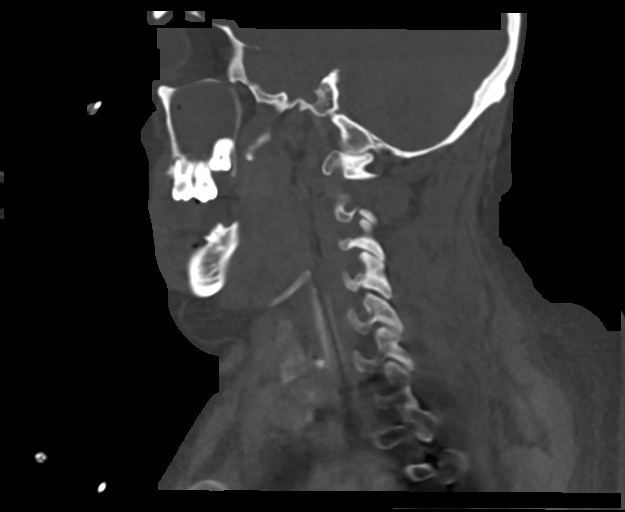
[im 95/143  bone]
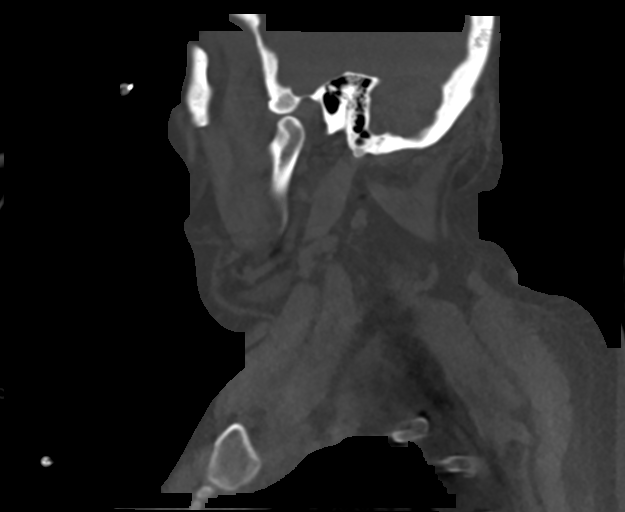

[Series 7: cor neck · coronal · 0.45mm/px · 3 of 148 slices shown]
[im 30/148  bone]
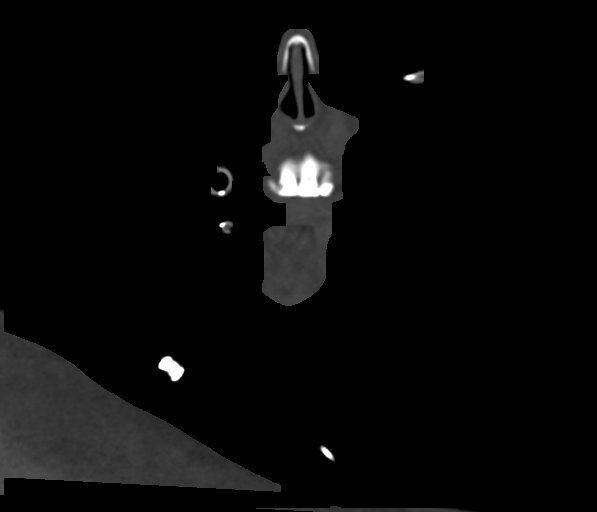
[im 59/148  bone]
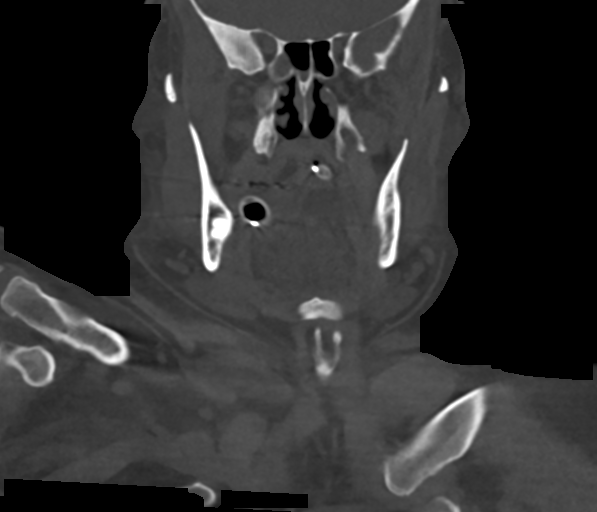
[im 89/148  bone]
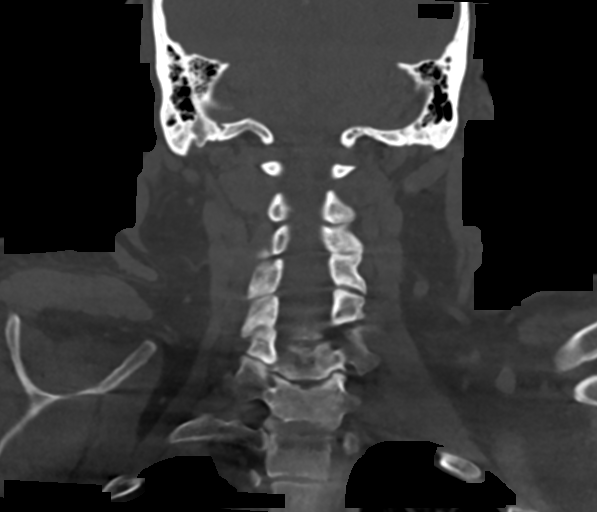

[14 of 34 positions shown; findings below may reference images not displayed]

FINDINGS: Pharynx and larynx: No gross swelling or mass although assessment is
limited by collapse of the pharyngeal and laryngeal soft tissues
around the endotracheal and enteric tubes as well as by the absence
of IV contrast. No retropharyngeal fluid collection.

Salivary glands: No inflammation, mass, or stone.

Thyroid: Unremarkable.

Lymph nodes: No enlarged or suspicious lymph nodes in the neck.

Vascular: Limited assessment in the absence of IV contrast.

Limited intracranial: More fully evaluated on today's head CT.

Visualized orbits: Unremarkable.

Mastoids and visualized paranasal sinuses: Complete or near complete
opacification of the left maxillary sinus, anterior left ethmoid air
cells, and a hypoplastic left frontal sinus. Moderate
circumferential mucosal thickening in the right maxillary sinus.
Milder mucosal thickening and small volume fluid in the right
greater than left sphenoid sinuses. Clear mastoid air cells.

Skeleton: Dental caries.  Mild cervical spondylosis.

Upper chest: More fully evaluated on today's chest CT.

Other: None.
IMPRESSION: No acute abnormality identified in the neck.

## 2021-01-15 IMAGING — CT CT CHEST W/O CM
2 of 4 series · 14 of 36 positions shown, 17 images · IV contrast (APPLIED)
Comparison: Current chest radiograph.

CLINICAL DATA: Hemoptysis.  Soft tissue swelling.

EXAM:
CT CHEST WITHOUT CONTRAST
TECHNIQUE: Multidetector CT imaging of the chest was performed following the
standard protocol without IV contrast.

[Series 3: chest wo · axial · 0.98mm/px · z∈[-779,-505]mm · 11 of 163 slices shown, 14 images]
[im 13/163  mediastinal]
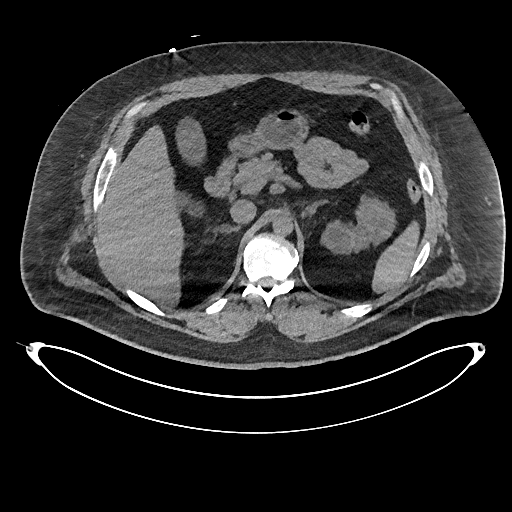
[im 13/163  lung]
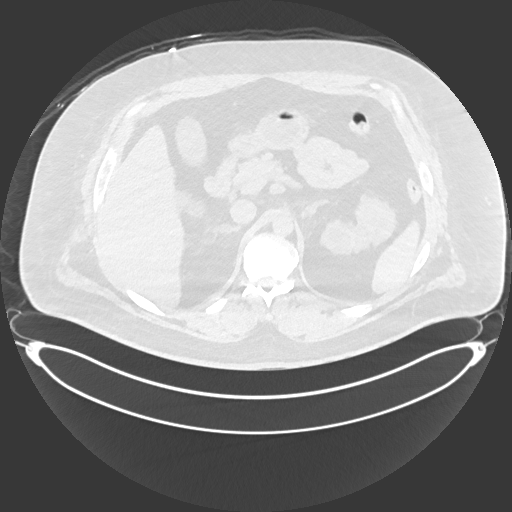
[im 25/163  lung]
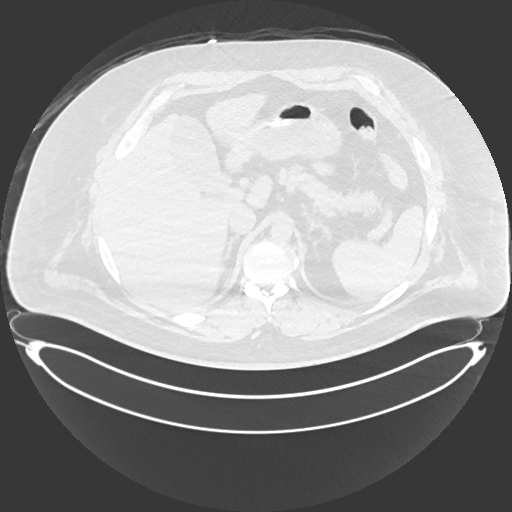
[im 38/163  lung]
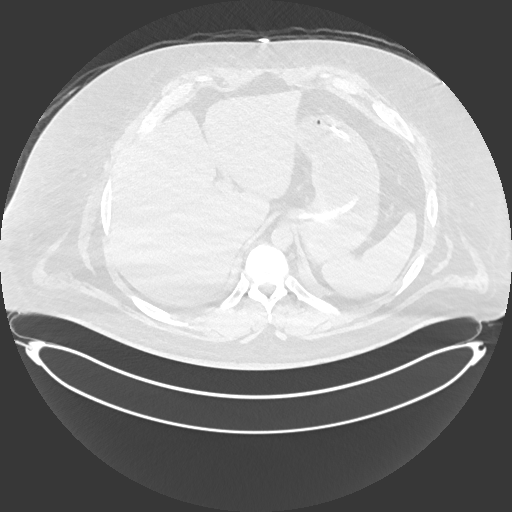
[im 50/163  lung]
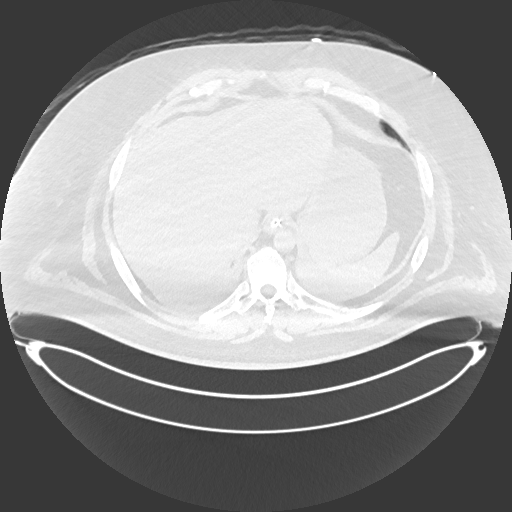
[im 63/163  mediastinal]
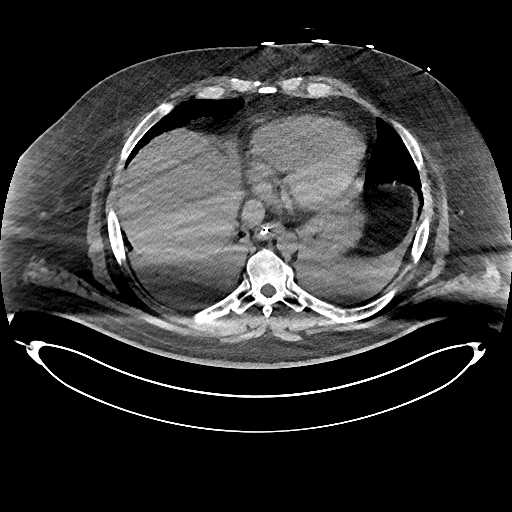
[im 63/163  lung]
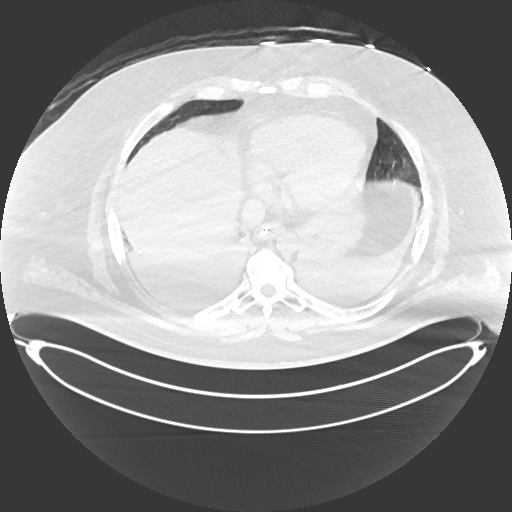
[im 88/163  lung]
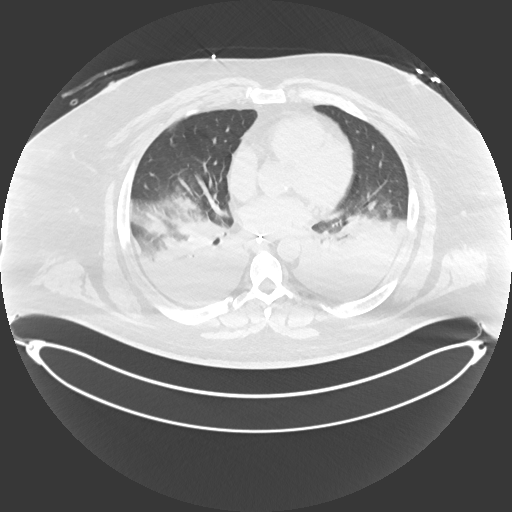
[im 100/163  lung]
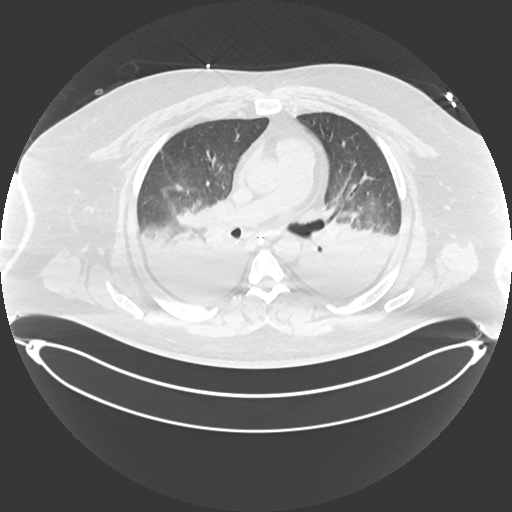
[im 113/163  lung]
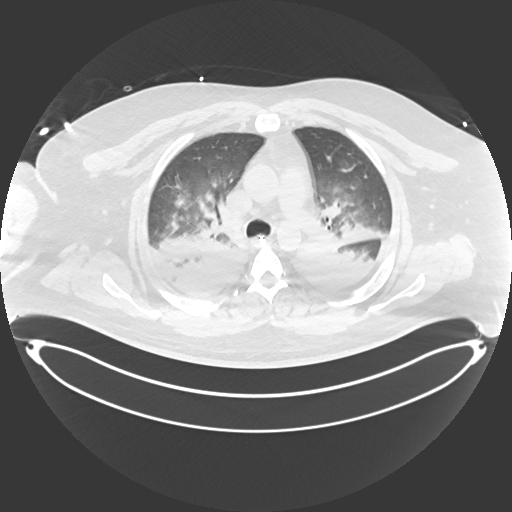
[im 125/163  mediastinal]
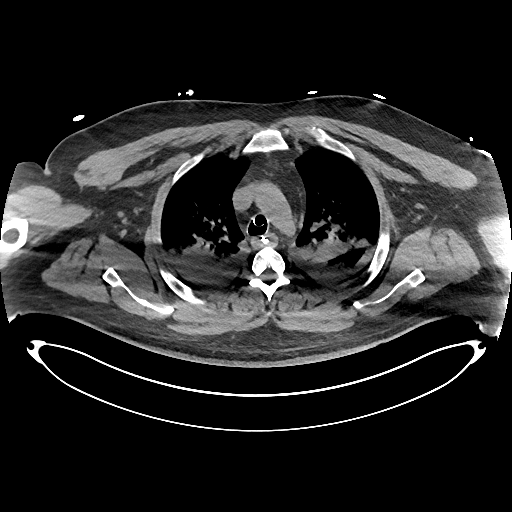
[im 125/163  lung]
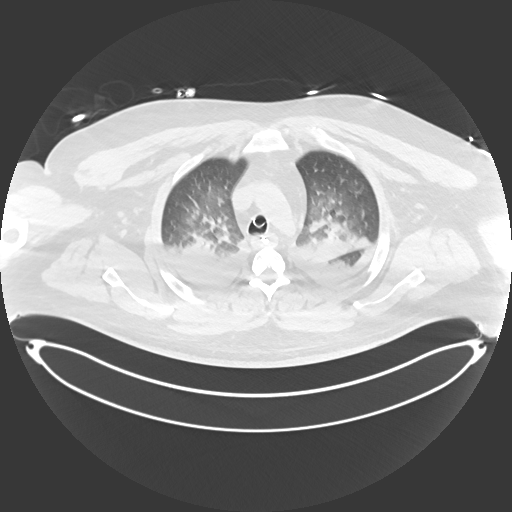
[im 138/163  lung]
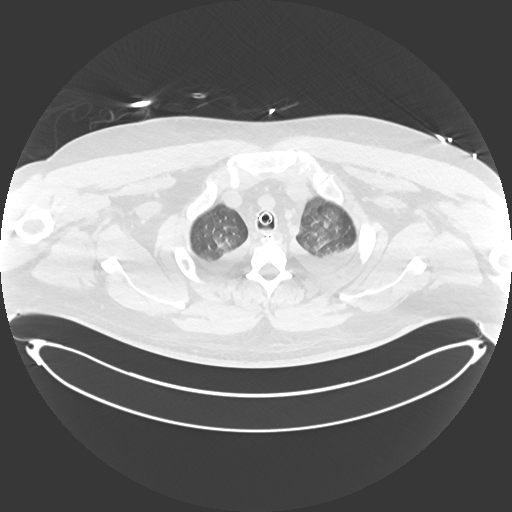
[im 150/163  lung]
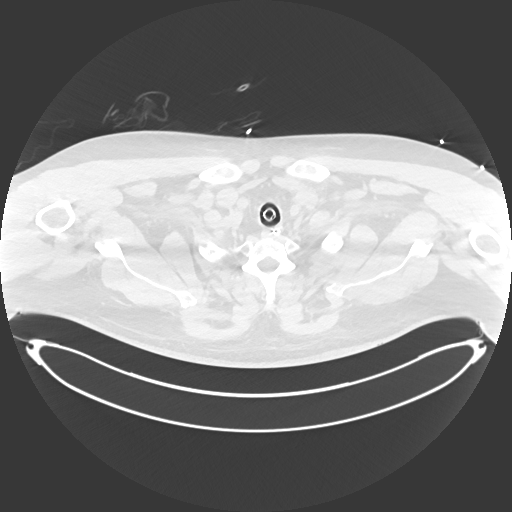

[Series 6: cor · coronal · 0.62mm/px · 3 of 173 slices shown]
[im 35/173  lung]
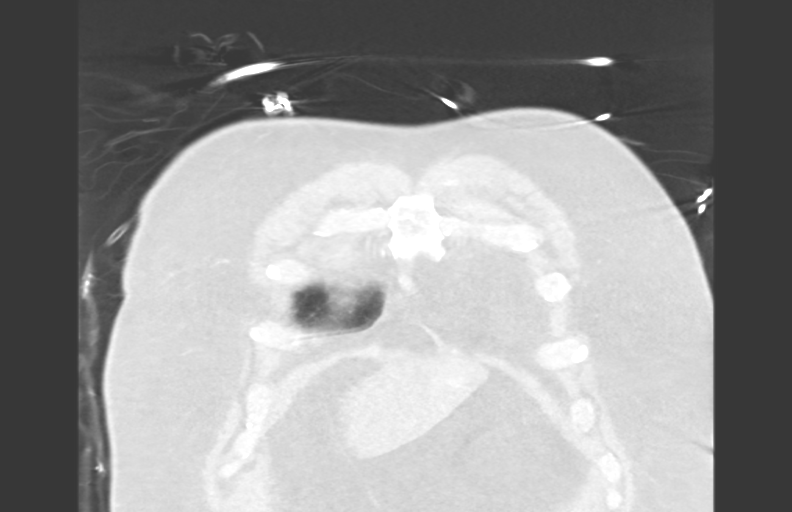
[im 69/173  lung]
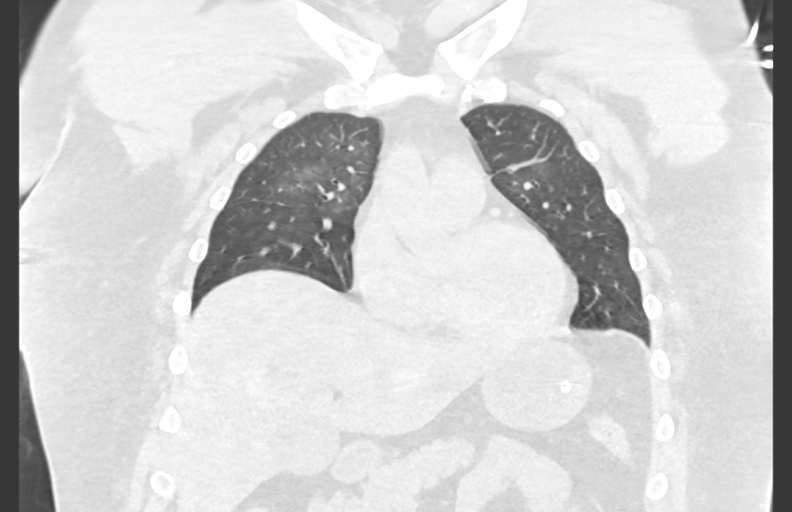
[im 104/173  lung]
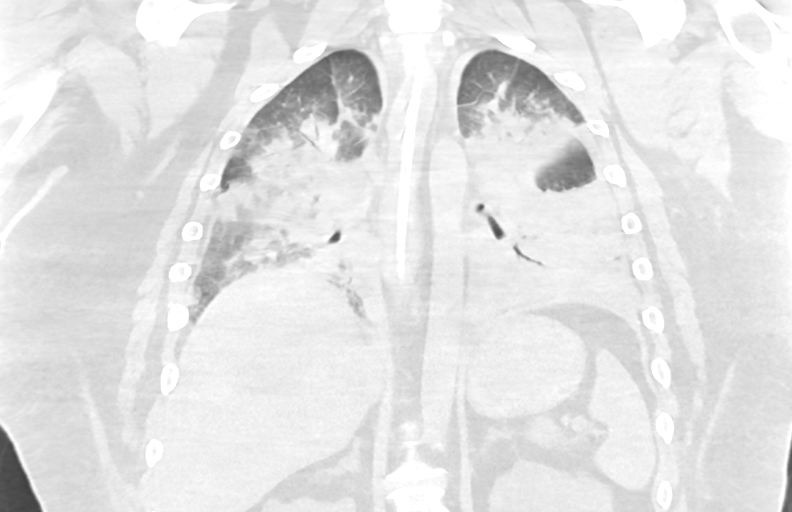

[14 of 36 positions shown; findings below may reference images not displayed]

FINDINGS: Cardiovascular: Heart is normal in size and configuration. No
pericardial effusion. Great vessels are normal in caliber. No
evidence of aortic injury or atherosclerosis.

Mediastinum/Nodes: No mediastinal hematoma. No neck base,
mediastinal or hilar masses or enlarged lymph nodes. Trachea and
esophagus unremarkable. Endotracheal tube tip projects 2.3 cm above
the carina. Nasal/orogastric tube passes below the diaphragm well
into the stomach.

Lungs/Pleura: Small to moderate bilateral pleural effusions. There
is associated dependent can fluent and adjacent hazy ground-glass
opacities as well as small ill-defined nodular opacities. Mild
interstitial thickening noted in the upper lobes. More anterior
aspects of the lungs are clear.

No pneumothorax.

Upper Abdomen: No acute findings. Poorly defined low-attenuation
masses in the upper poles the visualized kidneys consistent with
cysts and suggesting polycystic kidneys. Small gallstone.

Musculoskeletal: No fracture or acute finding.  No bone lesion.
IMPRESSION: 1. Since the previous day's chest radiograph, the left central
venous catheter has been removed. There is no evidence of an aortic
or other vascular injury.
2. Bilateral pleural effusions with dependent lung opacities both
confluent and ground-glass as well as small nodular opacities.
Findings support multifocal pneumonia.

## 2021-01-15 IMAGING — CT CT HEAD W/O CM
4 series · 15 of 47 positions shown, 17 images · non-contrast
Comparison: None.

CLINICAL DATA: Unresponsive.  History of CPR.

EXAM:
CT HEAD WITHOUT CONTRAST
TECHNIQUE: Contiguous axial images were obtained from the base of the skull
through the vertex without intravenous contrast.

[Series 3: head wo · axial · 0.39mm/px · z∈[-362,-242]mm · 7 of 34 slices shown, 9 images]
[im 5/34  brain]
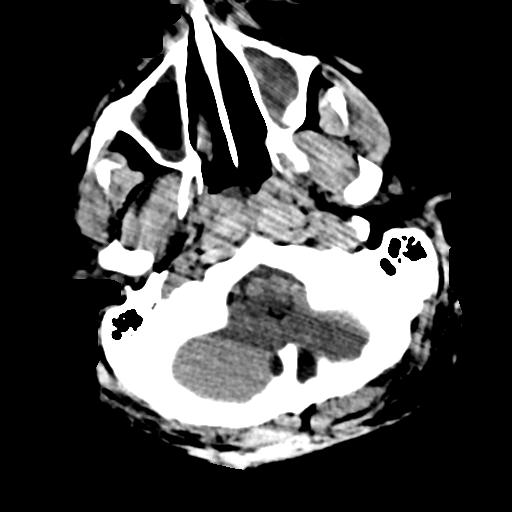
[im 5/34  bone]
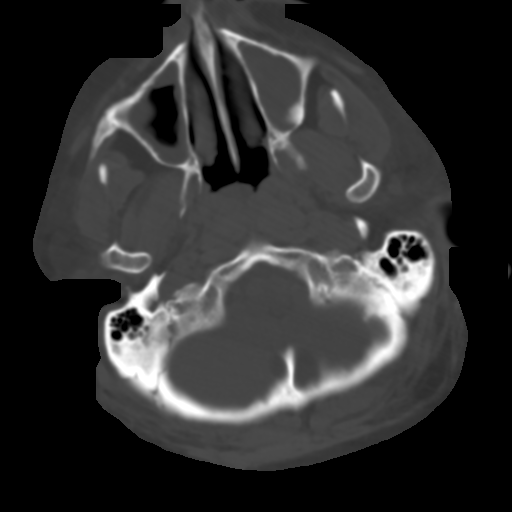
[im 9/34  brain]
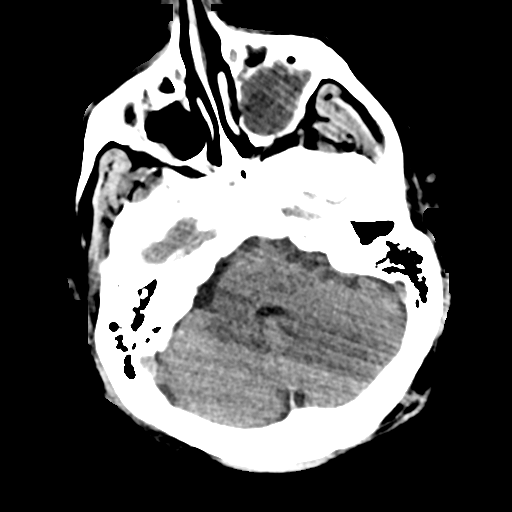
[im 13/34  brain]
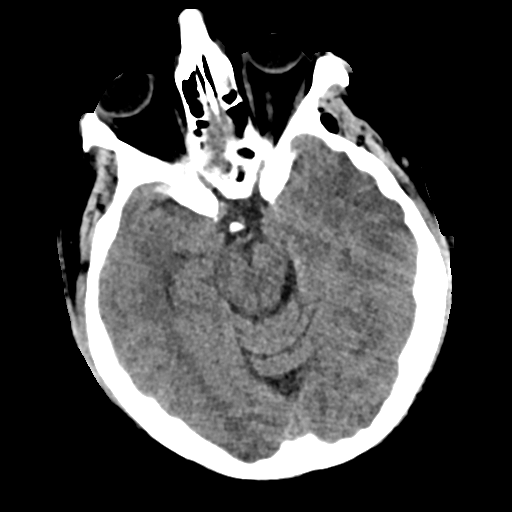
[im 17/34  brain]
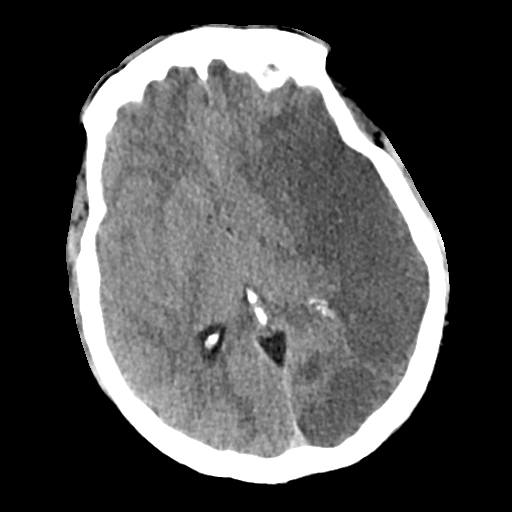
[im 21/34  brain]
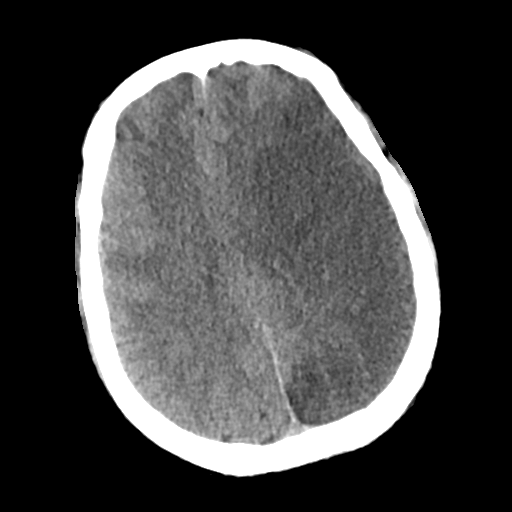
[im 21/34  bone]
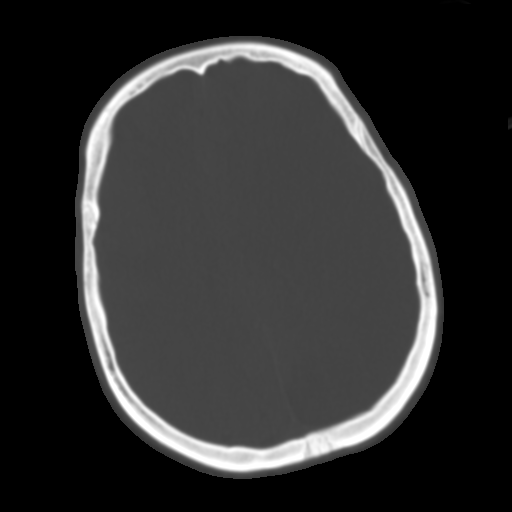
[im 25/34  brain]
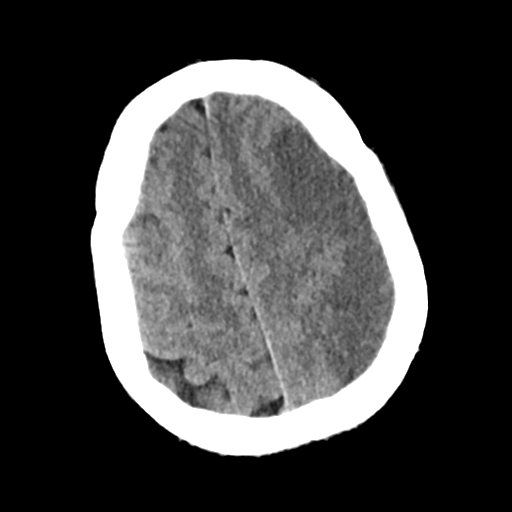
[im 29/34  brain]
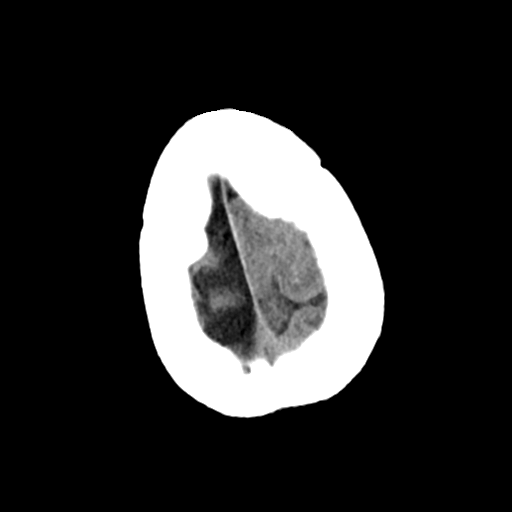

[Series 4: head bone · axial · 0.39mm/px · z∈[-366,-350]mm · 2 of 83 slices shown]
[im 9/83  bone]
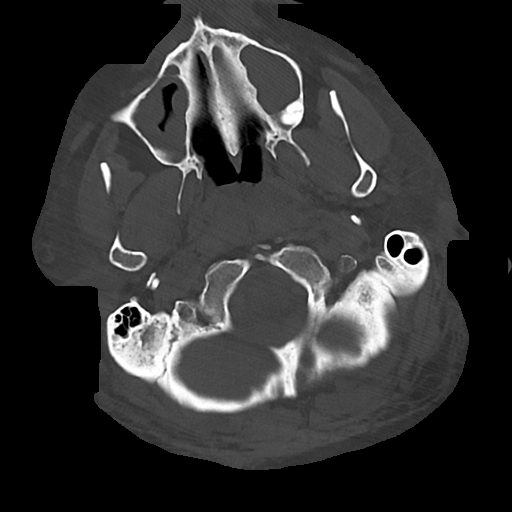
[im 17/83  bone]
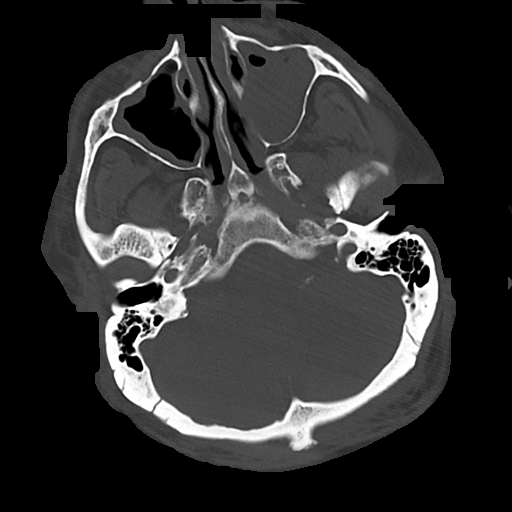

[Series 5: cor soft · coronal · 0.31mm/px · 3 of 75 slices shown]
[im 26/75  brain]
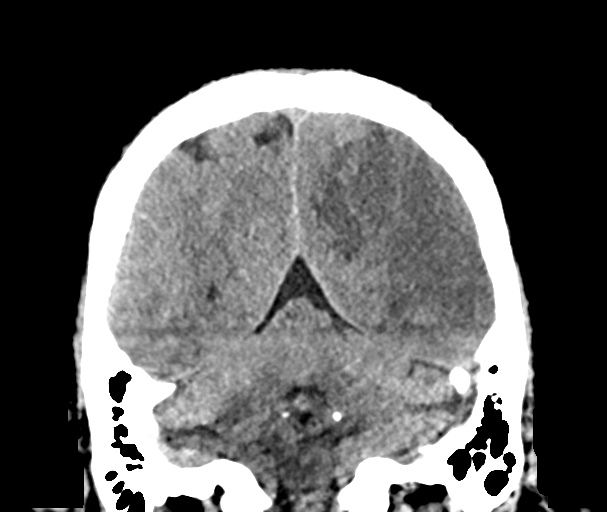
[im 34/75  brain]
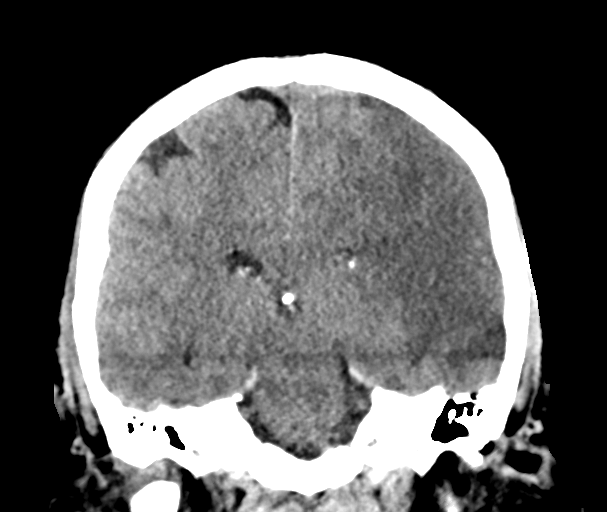
[im 42/75  brain]
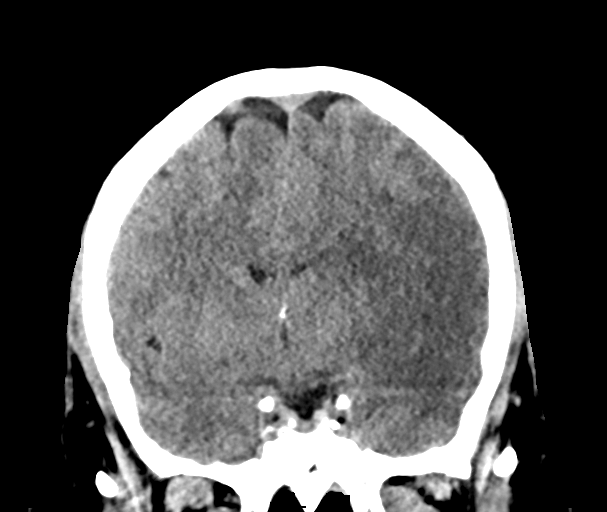

[Series 6: sag soft · sagittal · 0.30mm/px · 3 of 64 slices shown]
[im 22/64  brain]
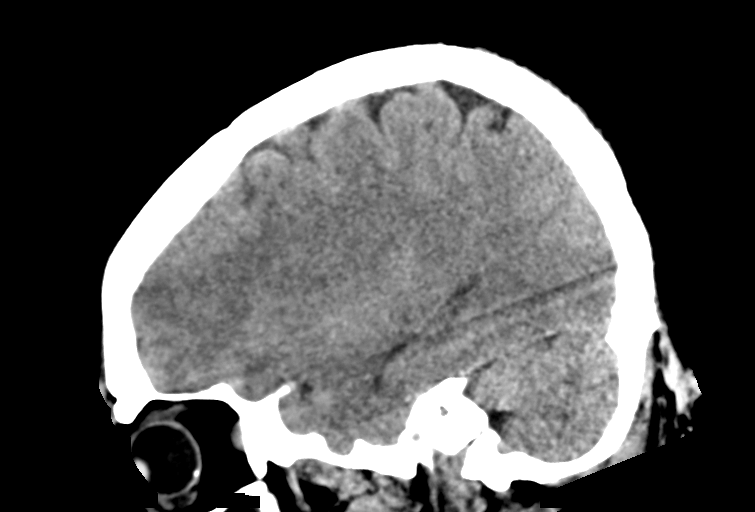
[im 32/64  brain]
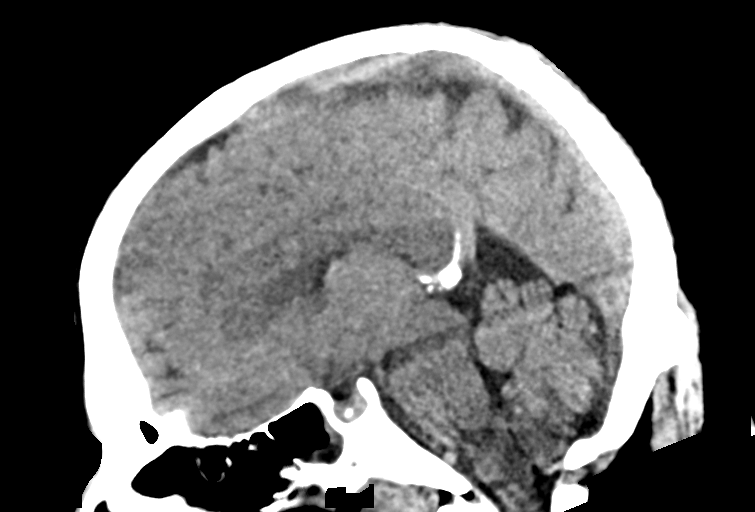
[im 43/64  brain]
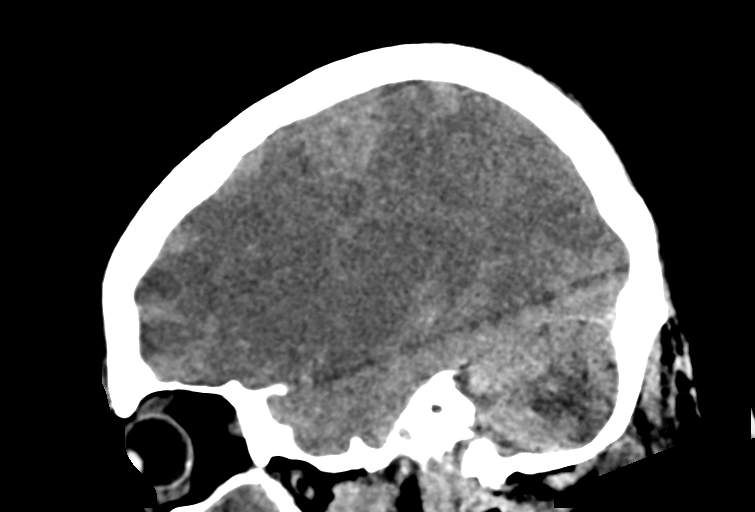

[15 of 47 positions shown; findings below may reference images not displayed]

FINDINGS: Brain: There is a large acute left MCA territory infarct also
involving some of the occipital lobe which could be due to a fetal
PCA or border zone MCA PCA infarct also. No findings for associated
hemorrhage. There is mass effect on the left lateral ventricle and
midline shift of 5 mm. No findings for downward transtentorial
herniation.

Vascular: Scattered vascular calcifications. No obvious hyperdense
MCA.

Skull: No skull fracture or bone lesions.

Sinuses/Orbits: Near complete opacification of the left maxillary
sinus, extensive mucoperiosteal thickening involving the right
maxillary sinus and some fluid in both halves of the sphenoid sinus.
Mastoid air cells and middle ear cavities are clear.

Other: No scalp lesions or scalp hematoma.
IMPRESSION: 1. Large acute left MCA territory infarct also involving part of the
occipital lobe.
2. No findings for associated hemorrhage.
3. 5 mm of midline shift. No findings for downward transtentorial
herniation.
4. Paranasal sinus disease.

These results were called by telephone at the time of interpretation
on [DATE] at [DATE] to provider [email or web address] , who verbally
acknowledged these results.

## 2021-01-15 MED ORDER — SODIUM CHLORIDE 3 % IV SOLN
INTRAVENOUS | Status: DC
  Administered 2021-01-16: 75 mL/h via INTRAVENOUS
  Filled 2021-01-15 (×6): qty 500

## 2021-01-15 MED ORDER — PERFLUTREN LIPID MICROSPHERE
1.0000 mL | INTRAVENOUS | Status: AC | PRN
  Administered 2021-01-15: 2 mL via INTRAVENOUS
  Filled 2021-01-15: qty 10

## 2021-01-15 MED ORDER — ATROPINE SULFATE 1 MG/10ML IJ SOSY
PREFILLED_SYRINGE | INTRAMUSCULAR | Status: AC
  Filled 2021-01-15: qty 10

## 2021-01-15 MED ORDER — SODIUM CHLORIDE 3 % IV BOLUS
100.0000 mL | Freq: Once | INTRAVENOUS | Status: AC
  Administered 2021-01-15: 100 mL via INTRAVENOUS
  Filled 2021-01-15: qty 500

## 2021-01-15 MED ORDER — SODIUM CHLORIDE 0.9 % IV SOLN
2.0000 g | INTRAVENOUS | Status: DC
  Administered 2021-01-15 – 2021-01-18 (×4): 2 g via INTRAVENOUS
  Filled 2021-01-15 (×5): qty 20

## 2021-01-15 MED ORDER — MAGNESIUM SULFATE 4 GM/100ML IV SOLN
4.0000 g | Freq: Once | INTRAVENOUS | Status: AC
  Administered 2021-01-15: 4 g via INTRAVENOUS
  Filled 2021-01-15: qty 100

## 2021-01-15 MED ORDER — NOREPINEPHRINE 4 MG/250ML-% IV SOLN
0.0000 ug/min | INTRAVENOUS | Status: DC
  Filled 2021-01-15: qty 250

## 2021-01-15 MED ORDER — SODIUM CHLORIDE 0.9 % IV SOLN
0.5000 ug/kg/min | INTRAVENOUS | Status: DC
  Administered 2021-01-16 (×4): 3 ug/kg/min via INTRAVENOUS
  Administered 2021-01-17: 1 ug/kg/min via INTRAVENOUS
  Administered 2021-01-17: 3 ug/kg/min via INTRAVENOUS
  Filled 2021-01-15 (×5): qty 20

## 2021-01-15 MED ORDER — ASPIRIN 81 MG PO CHEW
81.0000 mg | CHEWABLE_TABLET | Freq: Every day | ORAL | Status: DC
  Administered 2021-01-15 – 2021-01-18 (×4): 81 mg
  Filled 2021-01-15 (×4): qty 1

## 2021-01-15 MED ORDER — ASPIRIN 300 MG RE SUPP: 300.0000 mg | Freq: Every day | RECTAL | Status: DC

## 2021-01-15 MED ORDER — MAGNESIUM SULFATE 2 GM/50ML IV SOLN
2.0000 g | Freq: Once | INTRAVENOUS | Status: AC
  Administered 2021-01-15: 2 g via INTRAVENOUS
  Filled 2021-01-15: qty 50

## 2021-01-15 MED ORDER — NOREPINEPHRINE 4 MG/250ML-% IV SOLN
INTRAVENOUS | Status: AC
  Administered 2021-01-15: 4 mg
  Filled 2021-01-15: qty 250

## 2021-01-15 MED ORDER — MIDAZOLAM HCL 2 MG/2ML IJ SOLN: 2.0000 mg | INTRAMUSCULAR | Status: DC | PRN

## 2021-01-15 MED ORDER — EPINEPHRINE 1 MG/10ML IJ SOSY
PREFILLED_SYRINGE | INTRAMUSCULAR | Status: AC
  Filled 2021-01-15: qty 20

## 2021-01-15 MED ORDER — NOREPINEPHRINE 4 MG/250ML-% IV SOLN
INTRAVENOUS | Status: AC
  Administered 2021-01-15: 4 ug/min via INTRAVENOUS
  Filled 2021-01-15: qty 250

## 2021-01-15 MED ORDER — VITAL HIGH PROTEIN PO LIQD
1000.0000 mL | ORAL | Status: DC
  Administered 2021-01-15: 1000 mL

## 2021-01-15 MED ORDER — ETOMIDATE 2 MG/ML IV SOLN
INTRAVENOUS | Status: AC
  Administered 2021-01-15: 20 mg via INTRAVENOUS
  Filled 2021-01-15: qty 10

## 2021-01-15 MED ORDER — STERILE WATER FOR INJECTION IV SOLN
INTRAVENOUS | Status: DC
  Filled 2021-01-15: qty 1000

## 2021-01-15 NOTE — Progress Notes (Signed)
° °  NAME:  Sklyer Bage, MRN:  [id number], DOB:  1975/10/30, LOS: 1 ADMISSION DATE:  12/17/2020, CONSULTATION DATE:  01/04/2021 REFERRING MD:  Tomi Bamberger, CHIEF COMPLAINT:  hemoptysis   History of Present Illness:  OOH cardiac arrest preceded by hemoptysis Arrived to ER awake with ROSC but worsening respiratory status Intubated and sats remained in 60s.  PCCM consulted for admission.  Pertinent  Medical History  HTN PCKD  Significant Hospital Events: Including procedures, antibiotic start and stop dates in addition to other pertinent events   12/31 admission  Interim History / Subjective:  Much better oxygenation. Not making much urine. Remains on veletri, NMB  Objective   Blood pressure 104/81, pulse 70, temperature 99.1 F (37.3 C), resp. rate (!) 28, height 5\' 11"  (1.803 m), weight 127.4 kg, SpO2 97 %. CVP:  [13 mmHg-15 mmHg] 13 mmHg  Vent Mode: PRVC FiO2 (%):  [50 %-100 %] 50 % Set Rate:  [18 bmp-28 bmp] 28 bmp Vt Set:  [600 mL] 600 mL PEEP:  [8 cmH20-22 cmH20] 8 cmH20 Plateau Pressure:  [22 cmH20-38 cmH20] 22 cmH20   Intake/Output Summary (Last 24 hours) at 01/15/2021 1131 Last data filed at 01/15/2021 1000 Gross per 24 hour  Intake 9785.58 ml  Output 1175 ml  Net 8610.58 ml    Filed Weights   01/08/2021 1630 01/15/21 0445  Weight: 129.6 kg 127.4 kg    Examination: Sedated/paralyzed Lungs suprisingly clear Paralyzed Developing anasarca Abdomen soft with hypoactive BS  Mg low being repleted Cr stable Resolved Hospital Problem list   N/a  Assessment & Plan:  ARDS secondary to massive aspiration event Cocaine positive: probably led to above, ?epistaxis vs. alveolar hemorrhage Shock- related to aspiration event OOH cardiac arrest presumed due to the aspiration event with ROSC and apparently neurologically fine per EDP prior to intubation Heavy smoker, heavy drinker Accidental carotid cannulation- pressure held at site, looks fine now, will get duplex Acute  kidney injury- in setting of above, stable but oliguric  - Wean veletri - ARDSnet protocol PEEP/FiO2 titration; limit DP to < 15 as able - f/u echo, carotid duplex - CT sinus/ chest/ neck then will need to scope to look for source of this bleed, concerned there may be an underlying mass given smoking history - Will have nurse reach out to family - Start trickle feeds  Best Practice (right click and "Reselect all SmartList Selections" daily)   Diet/type: NPO DVT prophylaxis: not indicated GI prophylaxis: PPI Lines: yes and it is still needed Foley:  Yes, and it is still needed Code Status:  full code Last date of multidisciplinary goals of care discussion [pending]  34 minutes crit care time independent of procedures  Erskine Emery MD PCCM

## 2021-01-15 NOTE — Progress Notes (Signed)
Veltetri has been weaned off at this time.

## 2021-01-15 NOTE — Progress Notes (Signed)
Veletri dose decreased to 40 ng/kg

## 2021-01-15 NOTE — Progress Notes (Signed)
Veletri dose decreased to 20 ng/kg at 1115.  Syringe and tubing changed at 1125. Marland Kitchen

## 2021-01-15 NOTE — Progress Notes (Signed)
Pt transported to and from CT scan on the ventilator. 

## 2021-01-15 NOTE — Progress Notes (Signed)
CT head/neck/chest reviewed.  Large left MCA stroke. Left ICA looks fine by CT and ultrasound so doubt this was related to catheter.   More c/w cocaine-induced vasospasm Having some midline shift. Will add 3% and bring in family, I think this stroke is terminal.  Will have neuro weigh in as well.  Myrla Halsted MD PCCM

## 2021-01-15 NOTE — H&P (Deleted)
° °  NAME:  Brent Horn, MRN:  [id number], DOB:  Aug 10, 1975, LOS: 1 ADMISSION DATE:  Feb 02, 2021, CONSULTATION DATE:  02-Feb-2021 REFERRING MD:  Lynelle Doctor, CHIEF COMPLAINT:  hemoptysis   History of Present Illness:  OOH cardiac arrest preceded by hemoptysis Arrived to ER awake with ROSC but worsening respiratory status Intubated and sats remained in 60s.  PCCM consulted for admission.  Pertinent  Medical History  HTN PCKD  Significant Hospital Events: Including procedures, antibiotic start and stop dates in addition to other pertinent events   12/31 admission  Interim History / Subjective:  Much better oxygenation. Not making much urine. Remains on veletri, NMB  Objective   Blood pressure 110/78, pulse 77, temperature 99.1 F (37.3 C), resp. rate (!) 0, height 5\' 11"  (1.803 m), weight 127.4 kg, SpO2 98 %. CVP:  [13 mmHg-15 mmHg] 13 mmHg  Vent Mode: PRVC FiO2 (%):  [60 %-100 %] 60 % Set Rate:  [18 bmp-28 bmp] 28 bmp Vt Set:  [600 mL] 600 mL PEEP:  [8 cmH20-22 cmH20] 8 cmH20 Plateau Pressure:  [24 cmH20-38 cmH20] 28 cmH20   Intake/Output Summary (Last 24 hours) at 01/15/2021 03/15/2021 Last data filed at 01/15/2021 0600 Gross per 24 hour  Intake 8059.8 ml  Output 700 ml  Net 7359.8 ml   Filed Weights   Feb 02, 2021 1630 01/15/21 0445  Weight: 129.6 kg 127.4 kg    Examination: Sedated/paralyzed Lungs suprisingly clear Paralyzed Developing anasarca Abdomen soft with hypoactive BS  Mg low being repleted Cr stable Resolved Hospital Problem list   N/a  Assessment & Plan:  ARDS secondary to massive aspiration event Shock- related to aspiration event OOH cardiac arrest presumed due to the aspiration event with ROSC and apparently neurologically fine per EDP prior to intubation Heavy smoker, heavy drinker Accidental carotid cannulation- pressure held at site, looks fine now, will get duplex Acute kidney injury- in setting of above, stable but oliguric  - Wean veletri - ARDSnet  protocol PEEP/FiO2 titration; limit DP to < 15 as able - f/u echo, carotid duplex - CT sinus/ chest/ neck then will need to scope to look for source of this bleed, concerned there may be an underlying mass given smoking history - Will have nurse reach out to family - Start trickle feeds  Best Practice (right click and "Reselect all SmartList Selections" daily)   Diet/type: NPO DVT prophylaxis: not indicated GI prophylaxis: PPI Lines: yes and it is still needed Foley:  Yes, and it is still needed Code Status:  full code Last date of multidisciplinary goals of care discussion [pending]  34 minutes crit care time independent of procedures  03/15/21 MD PCCM

## 2021-01-15 NOTE — Progress Notes (Addendum)
CSW contacted by RN regarding dispute as to decision maker for this pt.  Pt was admitted last night, son Brent Horn [phone] has been here as Management consultant.  This AM pt wife/exwife Brent Horn?) [phone] called and stated she wants to be decision maker.    CSW spoke with [facility] supervisor Brent Horn who advised CSW speak to the parties and gather any information.    Pt mother Brent Horn is listed as the only contact on face sheet.    CSW spoke with son Brent Horn who reports his father and Brent Horn have not been together for a long time.  They are both in other relationships currently.  His father's current girlfriend is also at the hospital (Brent Horn-"with colorful hair")  He does not know whether his father and Brent Horn are in fact divorced or only separated, but they have not, to his knowledge, maintained contact and he does not want her involved. CSW explained the situation with need for one decision maker, that whether pt is still married to Brent Horn is relevant, son verbalized understanding and is going to investigate further if they are in fact divorced or not.  Son also has concerns that Brent Horn is "unfit" to be Management consultant.    Brent Horn, MSW, LCSW 1/1/202311:25 AM   CSW spoke with Brent Horn who reports she is not pt's mother, she is pt's friend's mother who has had a close relationship with pt after pt's own parents passed.  Per Brent Horn, pt is not divorced from [place].  She states they have had an on again off again relationship but are, in fact, still married.  CSW attepted to call Brent Horn.  No answer, unable to leave voicemail.  CSW messaged with Brent Horn who confirmed wife is decision maker if there is no divorce.   Brent Horn, MSW, LCSW 1/1/202311:32 AM   1140: TC from [place], pt current girlfriend.  [phone].  She also wanted to share that pt and Brent Horn are not in a current relationship but, to her knowledge, they are not legally divorced.  Pt has a 75  year old daughter that Brent Horn is currently caring for while pt is in the hospital.  Not clear if this is Brent Horn's daughter? Brent Horn, MSW, LCSW 1/1/202311:43 AM

## 2021-01-15 NOTE — Progress Notes (Addendum)
0700 - Report received from PM RN.  All questions answered.  Safety checks performed.  All lines and drips verified. Hand hygiene performed before/after each pt contact. 2162 - Dr. Katrinka Blazing rounded.  Verbal order to restart Bicarb infusion.  Order placed. 0800 - Assessment & Rx. [phone] - ECHO performed. 5072 Joni Reining from CT called to schedule scan.  Scan discussed with Brantly, RT.  Scan scheduled for 1400. 0930 - Brent Horn updated.  All questions answered.  She advised that pt has PMHx of crack cocaine use (smoked) and cocaine use (snorted).  She further advised that he drinks roughly a 12 pack of beer/day and smokes 1-2 packs of cigarettes/day.  Significant family Hx of heart and kidney disease.  Initially, Brent Horn advised that she wanted pt's children to make the decisions, since they've been separated for five years.  After we discussed that there needs to be one person as point of contact for the pt, she advised that she wanted to be the decision maker since they were still legally married. 2575 - Pt's daughter-in-law, Brent Horn, called for an update.  Brent Horn advised that Brent Horn and the pt were no longer married.  Brent Horn updated.  All questions answered. 1000 Brent Lites, LCSW notified of family dispute over decision making power. Pt's current girlfriend and daughter arrived to visit.  Equipment & Rx explained.  All questions answered. 1010 - Pt's son, Brent Horn (current Management consultant), called to obtain consent for bronchoscopy.  All questions answered.  He consented to bronchoscopy.  His consent was witnessed by Brent Horn, Charity fundraiser.  He advised that he would visit around 1600. 1021 - Doppler studies performed. 1100 - After results from BLE doppler study were published, showing no DVTs, SCDs were placed. 1140 Brent Lites, LCSW sent a secure chat updating pt's decision maker as Brent Horn, unless documentation arises stating that they are legally divorced.  Dr. Katrinka Blazing notified. 1230 - Brent Horn called back and advised that she  wanted the son, Brent Horn, to make pt's decisions.  Dr. Katrinka Blazing and Brent Lites, LCSW notified. 1330 - Pt transported to CT. 1430 - Pt returned from CT.  Dr. Katrinka Blazing notified.  Dr. Katrinka Blazing reviewed CT and requested to let the family know to come in for a family meeting. 1515 - Brent Horn (dtr-in-law) called to request she bring Brent Horn (pt's dtr).  Brent Horn advised that they would likely be here around 1630. 1520 - Pt's friend arrived to visit. 1630 - Dr. Viviann Spare rounded on pt.  Fentanyl turned down.  Nimbex and Propofol paused for neurological assessment. 1730 - Pt's family (son and daughter) arrived.  Drs. Katrinka Blazing and [employer].  Dr. Katrinka Blazing arrived to notify them of CT results.  Code status discussed.  Pt's son wants him to be a full code. 1815 - Pt began coughing and had a lot of bloody secretions coming out of his ETT.  Secretions very thick with blood clots present.  Dr. Katrinka Blazing present at bedside.  Dr. Katrinka Blazing elected to assist in removal of secretions via bronchoscope.  Consent in chart.  Family asked to wait in the waiting room.  Time out performed.  Hand hygiene performed.  Appropriate PPE worn by all in attendance.  Pt lavaged and secretions removed.  Sample sent to lab. 1830 - Pt's family arrived to visit.  Pt's SpO2 dropped into the mid 80s.  Respiratory and Dr. Katrinka Blazing notified.  PEEP increased to 20, per Dr. Katrinka Blazing and Nimbex restarted. 1845 - Reviewed equipment, drips, and heart monitor with pt's son.  All questions answered.  1900 - Report given to PM RN.  All questions answered.  1830 - Pt unable to follow commands.  Reflexive behaviors noted in L hand/arm & L foot/leg.  No obviously purposeful movement.  No movement noted to R extremities.

## 2021-01-15 NOTE — Consult Note (Addendum)
Stroke Neurology Consultation Note  Consult Requested by: Dr. Katrinka Blazing [facility]  Reason for Consult: CVA  Consult Date: 01/15/21   History of Present Illness:  Brent Horn is a 46 y.o. Caucasian male with PMH of  cocaine abuse. Presented in Cardiac arrest with family doing CPR for extended period of time. Had hemoptysis and respiratory distress in ER, intubated and sedated. 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 Currently he is unresponsive on vent. History obtained from Dr. Katrinka Blazing and EMR.   Past Medical History:  Diagnosis Date   Arthritis    Chronic back pain    Hypertension    Kidney stone    Polycystic kidney disease    Renal disorder     Past Surgical History:  Procedure Laterality Date   LITHOTRIPSY      No family history on file.  Social History:  reports that he has been smoking. He has quit using smokeless tobacco. He reports that he does not drink alcohol and does not use drugs.  Allergies: No Known Allergies  No current facility-administered medications on file prior to encounter.   Current Outpatient Medications on File Prior to Encounter  Medication Sig Dispense Refill   HYDROcodone-acetaminophen (NORCO/VICODIN) 5-325 MG tablet Take 1-2 tablets by mouth every 6 (six) hours as needed for severe pain. 8 tablet 0   predniSONE (DELTASONE) 10 MG tablet Begin with 6 tabs on day 1, 5 tab on day 2, 4 tab on day 3, 3 tab on day 4, 2 tab on day 5, 1 tab on day 6-take with food 21 tablet 0   tiZANidine (ZANAFLEX) 4 MG tablet Take 1 tablet (4 mg total) by mouth every 6 (six) hours as needed for muscle spasms. 60 tablet 0    Review of Systems: A full ROS was not completed due to AMS/Intubation.  Physical Examination: Temp:  [97.9 F (36.6 C)-101.7 F (38.7 C)] 97.9 F (36.6 C) (01/01 1645) Pulse Rate:  [57-103] 57 (01/01 1645) Resp:  [0-28] 28 (01/01 1645) BP: (89-126)/(71-85) 93/71 (01/01 1600) SpO2:  [91 %-100 %] 93 % (01/01 1736) FiO2 (%):  [50 %-100 %] 80 % (01/01  1736) Weight:  [127.4 kg] 127.4 kg (01/01 0445)  General - intubated and unresponsive.  Mental Status -  Sedation and paralytics stopped. Returned to examin patient after 40 minutes. Does not follow commands. Will move left UE/LE to painful stimuli but not to command.   Cranial Nerves II - XII - No blink to threat b/l. Pupils 3mm and extremely sluggish. Weak oculocephalic reflex. +corneal on the left, weak on the right.  +gag/cough.  Motor/Sensory: Will move left UE/LE to painful stimuli slightly. Will not localize.   No posturing. Will not move ext to command. Reflexes - left toe is down going. Right toe is mute.  Coordination - unable to examine.   Gait and Station - deferred.    Data Reviewed: CT HEAD WO CONTRAST ( )  Result Date: 01/15/2021 CLINICAL DATA:  Unresponsive.  History of CPR. EXAM: CT HEAD WITHOUT CONTRAST TECHNIQUE: Contiguous axial images were obtained from the base of the skull through the vertex without intravenous contrast. COMPARISON:  None. FINDINGS: Brain: There is a large acute left MCA territory infarct also involving some of the occipital lobe which could be due to a fetal PCA or border zone MCA PCA infarct also. No findings for associated hemorrhage. There is mass effect on the left lateral ventricle and midline shift of 5 mm. No findings for downward transtentorial herniation.  Vascular: Scattered vascular calcifications. No obvious hyperdense MCA. Skull: No skull fracture or bone lesions. Sinuses/Orbits: Near complete opacification of the left maxillary sinus, extensive mucoperiosteal thickening involving the right maxillary sinus and some fluid in both halves of the sphenoid sinus. Mastoid air cells and middle ear cavities are clear. Other: No scalp lesions or scalp hematoma. IMPRESSION: 1. Large acute left MCA territory infarct also involving part of the occipital lobe. 2. No findings for associated hemorrhage. 3. 5 mm of midline shift. No findings for downward  transtentorial herniation. 4. Paranasal sinus disease. These results were called by telephone at the time of interpretation on 01/15/2021 at 2:34 pm to [facility] , who verbally acknowledged these results. Electronically Signed   By: Rudie MeyerP.  Gallerani M.D.   On: 01/15/2021 14:34   CT SOFT TISSUE NECK WO CONTRAST  Result Date: 01/15/2021 CLINICAL DATA:  Soft tissue swelling. EXAM: CT NECK WITHOUT CONTRAST TECHNIQUE: Multidetector CT imaging of the neck was performed following the standard protocol without intravenous contrast. COMPARISON:  None. FINDINGS: Pharynx and larynx: No gross swelling or mass although assessment is limited by collapse of the pharyngeal and laryngeal soft tissues around the endotracheal and enteric tubes as well as by the absence of IV contrast. No retropharyngeal fluid collection. Salivary glands: No inflammation, mass, or stone. Thyroid: Unremarkable. Lymph nodes: No enlarged or suspicious lymph nodes in the neck. Vascular: Limited assessment in the absence of IV contrast. Limited intracranial: More fully evaluated on today's head CT. Visualized orbits: Unremarkable. Mastoids and visualized paranasal sinuses: Complete or near complete opacification of the left maxillary sinus, anterior left ethmoid air cells, and a hypoplastic left frontal sinus. Moderate circumferential mucosal thickening in the right maxillary sinus. Milder mucosal thickening and small volume fluid in the right greater than left sphenoid sinuses. Clear mastoid air cells. Skeleton: Dental caries.  Mild cervical spondylosis. Upper chest: More fully evaluated on today's chest CT. Other: None. IMPRESSION: No acute abnormality identified in the neck. Electronically Signed   By: Sebastian AcheAllen  Grady M.D.   On: 01/15/2021 15:11   CT CHEST WO CONTRAST  Result Date: 01/15/2021 CLINICAL DATA:  Hemoptysis.  Soft tissue swelling. EXAM: CT CHEST WITHOUT CONTRAST TECHNIQUE: Multidetector CT imaging of the chest was performed  following the standard protocol without IV contrast. COMPARISON:  Current chest radiograph. FINDINGS: Cardiovascular: Heart is normal in size and configuration. No pericardial effusion. Great vessels are normal in caliber. No evidence of aortic injury or atherosclerosis. Mediastinum/Nodes: No mediastinal hematoma. No neck base, mediastinal or hilar masses or enlarged lymph nodes. Trachea and esophagus unremarkable. Endotracheal tube tip projects 2.3 cm above the carina. Nasal/orogastric tube passes below the diaphragm well into the stomach. Lungs/Pleura: Small to moderate bilateral pleural effusions. There is associated dependent can fluent and adjacent hazy ground-glass opacities as well as small ill-defined nodular opacities. Mild interstitial thickening noted in the upper lobes. More anterior aspects of the lungs are clear. No pneumothorax. Upper Abdomen: No acute findings. Poorly defined low-attenuation masses in the upper poles the visualized kidneys consistent with cysts and suggesting polycystic kidneys. Small gallstone. Musculoskeletal: No fracture or acute finding.  No bone lesion. IMPRESSION: 1. Since the previous day's chest radiograph, the left central venous catheter has been removed. There is no evidence of an aortic or other vascular injury. 2. Bilateral pleural effusions with dependent lung opacities both confluent and ground-glass as well as small nodular opacities. Findings support multifocal pneumonia. Electronically Signed   By: Renard Hamperavid  Ormond M.D.  On: 01/15/2021 14:17   DG CHEST PORT 1 VIEW  Result Date: 01/29/21 CLINICAL DATA:  Central line placement. EXAM: PORTABLE CHEST 1 VIEW COMPARISON:  Radiograph of same day. FINDINGS: The heart size and mediastinal contours are within normal limits. Endotracheal and nasogastric tubes are unchanged. Bilateral lung opacities are noted, right greater than left, concerning for pneumonia or possibly edema. There is been interval placement of  left-sided catheter with distal tip projected over the left side of the midthoracic spine which is not typical for for jugular placement. The visualized skeletal structures are unremarkable. IMPRESSION: Interval placement of left-sided catheter with distal tip projected over left side of midthoracic spine which is not typical for internal jugular placement. This is concerning for possible inadvertent arterial puncture with tip in thoracic aorta. These results will be called to the ordering clinician or representative by the Radiologist Assistant, and communication documented in the PACS or zVision Dashboard. Electronically Signed   By: Lupita Raider M.D.   On: 29-Jan-2021 17:40   DG Chest Portable 1 View  Result Date: 01-29-2021 CLINICAL DATA:  Patient presents from home unresponsive. EXAM: PORTABLE CHEST 1 VIEW COMPARISON:  10/06/2016 FINDINGS: Bilateral hazy airspace lung opacities are the central prominence. No convincing pleural effusion or pneumothorax on the semi-erect study. Cardiac silhouette normal in size.  No mediastinal or hilar masses. Endotracheal tube tip projects 3.4 cm above the Carina. Nasal/orogastric tube passes well below the diaphragm, into the stomach and below the included field of view. IMPRESSION: 1. Hazy bilateral airspace lung opacities, pattern suspected to be pulmonary edema, although multifocal pneumonia is also possible. 2. Well-positioned endotracheal tube and nasal/orogastric tube. Electronically Signed   By: Amie Portland M.D.   On: 29-Jan-2021 15:58   ECHOCARDIOGRAM COMPLETE  Result Date: 01/15/2021    ECHOCARDIOGRAM REPORT   Patient Name:   Yer Imes Date of Exam: 01/15/2021 Medical Rec #:  [id number]         Height:       71.0 in Accession #:    [id number]        Weight:       280.9 lb Date of Birth:  [birth year]         BSA:          2.436 m Patient Age:    45 years          BP:           104/75 mmHg Patient Gender: M                 HR:           76 bpm. Exam  Location:  Inpatient Procedure: 2D Echo, Cardiac Doppler, Color Doppler and Intracardiac            Opacification Agent Indications:    Cardiac arrest I46.9  History:        Patient has no prior history of Echocardiogram examinations.                 Risk Factors:Hypertension. Polysubstance abuse. Renal disorder.  Sonographer:    Leta Jungling RDCS Referring Phys: [id number] Lorin Glass  Sonographer Comments: Echo performed with patient supine and on artificial respirator. IMPRESSIONS  1. LV not well seen even with definity Abnromal septal motion mild global hypokinesis . Left ventricular ejection fraction, by estimation, is 45 to 50%. The left ventricle has mildly decreased function. The left ventricle has no regional wall motion abnormalities. Left ventricular diastolic parameters were  normal.  2. Right ventricular systolic function is normal. The right ventricular size is normal.  3. The mitral valve is normal in structure. Trivial mitral valve regurgitation. No evidence of mitral stenosis.  4. The aortic valve is tricuspid. Aortic valve regurgitation is not visualized. No aortic stenosis is present.  5. The inferior vena cava is normal in size with greater than 50% respiratory variability, suggesting right atrial pressure of 3 mmHg. FINDINGS  Left Ventricle: LV not well seen even with definity Abnromal septal motion mild global hypokinesis. Left ventricular ejection fraction, by estimation, is 45 to 50%. The left ventricle has mildly decreased function. The left ventricle has no regional wall motion abnormalities. Definity contrast agent was given IV to delineate the left ventricular endocardial borders. The left ventricular internal cavity size was normal in size. There is no left ventricular hypertrophy. Left ventricular diastolic parameters were normal. Right Ventricle: The right ventricular size is normal. No increase in right ventricular wall thickness. Right ventricular systolic function is normal. Left  Atrium: Left atrial size was normal in size. Right Atrium: Right atrial size was normal in size. Pericardium: There is no evidence of pericardial effusion. Mitral Valve: The mitral valve is normal in structure. Trivial mitral valve regurgitation. No evidence of mitral valve stenosis. Tricuspid Valve: The tricuspid valve is normal in structure. Tricuspid valve regurgitation is not demonstrated. No evidence of tricuspid stenosis. Aortic Valve: The aortic valve is tricuspid. Aortic valve regurgitation is not visualized. No aortic stenosis is present. Pulmonic Valve: The pulmonic valve was normal in structure. Pulmonic valve regurgitation is not visualized. No evidence of pulmonic stenosis. Aorta: The aortic root is normal in size and structure. Venous: The inferior vena cava is normal in size with greater than 50% respiratory variability, suggesting right atrial pressure of 3 mmHg. IAS/Shunts: No atrial level shunt detected by color flow Doppler.  LEFT VENTRICLE PLAX 2D LVIDd:         5.30 cm     Diastology LVIDs:         3.60 cm     LV e' medial:    6.49 cm/s LV PW:         0.80 cm     LV E/e' medial:  6.7 LV IVS:        0.80 cm     LV e' lateral:   6.20 cm/s LVOT diam:     2.00 cm     LV E/e' lateral: 7.0 LV SV:         39 LV SV Index:   16 LVOT Area:     3.14 cm  LV Volumes (MOD) LV vol d, MOD A2C: 79.5 ml LV vol d, MOD A4C: 74.1 ml LV vol s, MOD A2C: 49.3 ml LV vol s, MOD A4C: 41.1 ml LV SV MOD A2C:     30.2 ml LV SV MOD A4C:     74.1 ml LV SV MOD BP:      30.4 ml RIGHT VENTRICLE RV S prime:     7.95 cm/s TAPSE (M-mode): 2.0 cm LEFT ATRIUM             Index        RIGHT ATRIUM          Index LA diam:        2.80 cm 1.15 cm/m   RA Area:     9.43 cm LA Vol (A2C):   25.8 ml 10.59 ml/m  RA Volume:   17.90 ml 7.35 ml/m  LA Vol (A4C):   19.1 ml 7.84 ml/m LA Biplane Vol: 22.4 ml 9.20 ml/m  AORTIC VALVE LVOT Vmax:   71.10 cm/s LVOT Vmean:  50.800 cm/s LVOT VTI:    0.123 m  AORTA Ao Root diam: 3.30 cm Ao Asc diam:   3.10 cm MITRAL VALVE MV Area (PHT): 2.95 cm    SHUNTS MV Decel Time: 257 msec    Systemic VTI:  0.12 m MV E velocity: 43.40 cm/s  Systemic Diam: 2.00 cm MV A velocity: 45.60 cm/s MV E/A ratio:  0.95 Charlton Haws MD Electronically signed by Charlton Haws MD Signature Date/Time: 01/15/2021/10:05:03 AM    Final    VAS US CAROTID  Result Date: 01/15/2021 Carotid Arterial Duplex Study Patient Name:  CAMMERON GREIS  Date of Exam:   01/15/2021 Medical Rec #: [id number]          Accession #:    [id number] Date of Birth: 07/01/1975          Patient Gender: M Patient Age:   29 years Exam Location:  [facility] Procedure:      VAS US CAROTID Referring Phys: Levon Hedger --------------------------------------------------------------------------------  Indications:       Left dissection. Risk Factors:      Hypertension. Limitations        Today's exam was limited due to patient on a ventilator and                    patient positioning. Comparison Study:  no prior Performing Technologist: [place] Ponder RVS  Examination Guidelines: A complete evaluation includes B-mode imaging, spectral Doppler, color Doppler, and power Doppler as needed of all accessible portions of each vessel. Bilateral testing is considered an integral part of a complete examination. Limited examinations for reoccurring indications may be performed as noted.  Right Carotid Findings: +----------+--------+--------+--------+------------------+--------+             PSV cm/s EDV cm/s Stenosis Plaque Description Comments  +----------+--------+--------+--------+------------------+--------+  CCA Prox   54       14                heterogenous                 +----------+--------+--------+--------+------------------+--------+  CCA Distal 36       14                heterogenous                 +----------+--------+--------+--------+------------------+--------+  ICA Prox   44       19       1-39%    heterogenous                  +----------+--------+--------+--------+------------------+--------+  ICA Distal 81       30                                             +----------+--------+--------+--------+------------------+--------+  ECA        55       10                                             +----------+--------+--------+--------+------------------+--------+ +----------+--------+-------+--------+-------------------+  PSV cm/s EDV cms Describe Arm Pressure (mmHG)  +----------+--------+-------+--------+-------------------+  Subclavian 48                                             +----------+--------+-------+--------+-------------------+ +---------+--------+--+--------+--+---------+  Vertebral PSV cm/s 47 EDV cm/s 16 Antegrade  +---------+--------+--+--------+--+---------+  Left Carotid Findings: +----------+--------+--------+--------+------------------+--------+             PSV cm/s EDV cm/s Stenosis Plaque Description Comments  +----------+--------+--------+--------+------------------+--------+  CCA Prox   52       9                 heterogenous                 +----------+--------+--------+--------+------------------+--------+  CCA Distal 29       10                heterogenous                 +----------+--------+--------+--------+------------------+--------+  ICA Prox   27       11       1-39%    heterogenous                 +----------+--------+--------+--------+------------------+--------+  ICA Distal 70       22                                             +----------+--------+--------+--------+------------------+--------+  ECA        49       9                                              +----------+--------+--------+--------+------------------+--------+ +----------+--------+--------+--------+-------------------+             PSV cm/s EDV cm/s Describe Arm Pressure (mmHG)  +----------+--------+--------+--------+-------------------+  Subclavian 55                                               +----------+--------+--------+--------+-------------------+ +---------+--------+--+--------+--+---------+  Vertebral PSV cm/s 37 EDV cm/s 14 Antegrade  +---------+--------+--+--------+--+---------+   Summary: Right Carotid: Velocities in the right ICA are consistent with a 1-39% stenosis. Left Carotid: Velocities in the left ICA are consistent with a 1-39% stenosis.               No evidence of dissection in the left cca/ ica.  *See table(s) above for measurements and observations.  Electronically signed by Sherald Hess MD on 01/15/2021 at 11:34:14 AM.    Final    VAS [place] LOWER EXTREMITY VENOUS (DVT)  Result Date: 01/15/2021  Lower Venous DVT Study Patient Name:  ATWELL MCDANEL  Date of Exam:   01/15/2021 Medical Rec #: [id number]          Accession #:    [id number] Date of Birth: [birth year]          Patient Gender: M Patient Age:   38 years Exam Location:  [facility] Procedure:      VAS [place] LOWER EXTREMITY VENOUS (DVT) Referring Phys:  DANIEL SMITH --------------------------------------------------------------------------------  Indications: Edema.  Comparison Study: no prior Performing Technologist: [place] Ponder RVS  Examination Guidelines: A complete evaluation includes B-mode imaging, spectral Doppler, color Doppler, and power Doppler as needed of all accessible portions of each vessel. Bilateral testing is considered an integral part of a complete examination. Limited examinations for reoccurring indications may be performed as noted. The reflux portion of the exam is performed with the patient in reverse Trendelenburg.  +---------+---------------+---------+-----------+----------+--------------+  RIGHT     Compressibility Phasicity Spontaneity Properties Thrombus Aging  +---------+---------------+---------+-----------+----------+--------------+  CFV       Full            Yes       Yes                                    +---------+---------------+---------+-----------+----------+--------------+   SFJ       Full                                                             +---------+---------------+---------+-----------+----------+--------------+  FV Prox   Full                                                             +---------+---------------+---------+-----------+----------+--------------+  FV Mid    Full                                                             +---------+---------------+---------+-----------+----------+--------------+  FV Distal Full                                                             +---------+---------------+---------+-----------+----------+--------------+  PFV       Full                                                             +---------+---------------+---------+-----------+----------+--------------+  POP       Full            Yes       Yes                                    +---------+---------------+---------+-----------+----------+--------------+  PTV       Full                                                             +---------+---------------+---------+-----------+----------+--------------+  PERO      Full                                                             +---------+---------------+---------+-----------+----------+--------------+   +---------+---------------+---------+-----------+----------+--------------+  LEFT      Compressibility Phasicity Spontaneity Properties Thrombus Aging  +---------+---------------+---------+-----------+----------+--------------+  CFV       Full            Yes       Yes                                    +---------+---------------+---------+-----------+----------+--------------+  SFJ       Full                                                             +---------+---------------+---------+-----------+----------+--------------+  FV Prox   Full                                                             +---------+---------------+---------+-----------+----------+--------------+  FV Mid    Full                                                              +---------+---------------+---------+-----------+----------+--------------+  FV Distal Full                                                             +---------+---------------+---------+-----------+----------+--------------+  PFV       Full                                                             +---------+---------------+---------+-----------+----------+--------------+  POP       Full            Yes       Yes                                    +---------+---------------+---------+-----------+----------+--------------+  PTV       Full                                                             +---------+---------------+---------+-----------+----------+--------------+  PERO      Full                                                             +---------+---------------+---------+-----------+----------+--------------+     Summary: BILATERAL: - No evidence of deep vein thrombosis seen in the lower extremities, bilaterally. -No evidence of popliteal cyst, bilaterally.   *See table(s) above for measurements and observations. Electronically signed by Sherald Hess MD on 01/15/2021 at 11:34:37 AM.    Final     Assessment: 46 y.o. male with cardiac arrest at home. Intubated and sedated in the ER. Large left MCA stroke discovered on CT head today with 5mm shift. He did have accidental carotid cannulation yesterday. Unclear if he has anoxic brain injury from cardiac event. Prognosis is poor.   Plan: -Hypertonic saline started by ICU. Goal Na is 150-155. Check Na q 6hrs.  - HgbA1c, fasting lipid panel - MRI, MRA  of the brain without contrast later today to eval for worsening shift and help with prognosis looks to global anoxic injury.If unable to get MRI, will need repeat CTH in 6hrs. - PT consult, OT consult, Speech consult - Echocardiogram- Risk factor modification - Telemetry monitoring -Stat aspirin 300mg  rectal. - Frequent neuro checks q1hr. Stat CT if change in neuro  exam.   Discussed case with Dr. Katrinka Blazing. He will update family. He is in serious condition with potential for herniation or hemorrage.   Thank you for this consultation and allowing [place] to participate in the care of this patient.   Stroke team will continue to follow.    This patient is critically ill due to respiratory distress, large left MCA CVA with shift on hypertonic saline and at significant risk of neurological worsening, death form heart failure, respiratory failure, recurrent stroke, bleeding from [facility], seizure, sepsis. This patient's care requires constant monitoring of vital signs, hemodynamics, respiratory and cardiac monitoring, review of multiple databases, neurological assessment, discussion with Dr. Katrinka Blazing other specialists and medical decision making of high complexity. Imaging reviewed personally. I spent 60 minutes of neurocritical care time in the care of this patient.   Joan Avetisyan,MD

## 2021-01-15 NOTE — Progress Notes (Signed)
Lower extremity venous and carotid duplex has been completed.   Preliminary results in CV Proc.   Brent Horn 01/15/2021 11:31 AM

## 2021-01-15 NOTE — Progress Notes (Signed)
Veletri dose decreased to 30 ng/kg

## 2021-01-15 NOTE — Procedures (Signed)
Bronchoscopy Procedure Note  Brent Horn  [id number]  1975/10/11  Date:01/15/21  Time:6:40 PM   Provider Performing:Muath Hallam C Tamala Julian   Procedure(s):  Flexible bronchoscopy with bronchial alveolar lavage [phone])  Indication(s) Hemoptysis ongoing now with desats as patient awakening  Consent Risks of the procedure as well as the alternatives and risks of each were explained to the patient and/or caregiver.  Consent for the procedure was obtained and is signed in the bedside chart  Anesthesia Etomidate 67m   Time Out Verified patient identification, verified procedure, site/side was marked, verified correct patient position, special equipment/implants available, medications/allergies/relevant history reviewed, required imaging and test results available.   Sterile Technique Usual hand hygiene, masks, gowns, and gloves were used   Procedure Description Bronchoscope advanced through endotracheal tube and into airway.  Airways were examined down to subsegmental level with findings noted below.   Following diagnostic evaluation, serial lavage in lingula performed.  Findings:  Thick blood clot in ETT suctioned ETT in good position Serial lavage did not reveal signs of active alveolitis (became less sanguinous, see pic).  Complications/Tolerance None; patient tolerated the procedure well. Chest X-ray is not needed post procedure.   EBL Minimal   Specimen(s) BAL Lingula    Serial lavage

## 2021-01-15 NOTE — Progress Notes (Signed)
Spoke with neurology and family regarding head CT findings. Patient sedation lightened, some purposeful movement on L, not to command, dense R hemiplegia.  +cough/gag, triggering vent.  Son states goal is as much recovery as possible, even if in SNF. Remains full code.  Additional 65 min cc time coordinating care, updating family.   Myrla Halsted MD PCCM

## 2021-01-15 NOTE — Progress Notes (Signed)
Veletri dose changed to 10 ng/kg

## 2021-01-15 NOTE — Progress Notes (Signed)
[facility] ADULT ICU REPLACEMENT PROTOCOL   The patient does apply for the [facility] Adult ICU Electrolyte Replacment Protocol based on the criteria listed below:   1.Exclusion criteria: TCTS patients, ECMO patients, and Dialysis patients 2. Is GFR >/= 30 ml/min? Yes.    Patient's GFR today is 42 3. Is SCr </= 2? Yes.   Patient's SCr is 1.96 mg/dL 4. Did SCr increase >/= 0.5 in 24 hours? No. 5.Pt's weight >40kg  Yes.   6. Abnormal electrolyte(s): Mag  7. Electrolytes replaced per protocol 8.  Call MD STAT for K+ </= 2.5, Phos </= 1, or Mag </= 1 Physician:  [facility] 01/15/2021 5:37 AM

## 2021-01-15 NOTE — H&P (Deleted)
° °  NAME:  Brent Horn, MRN:  [id number], DOB:  [birth year], LOS: 1 ADMISSION DATE:  02/12/2021, CONSULTATION DATE:  Feb 12, 2021 REFERRING MD:  Lynelle Doctor, CHIEF COMPLAINT:  hemoptysis   History of Present Illness:  OOH cardiac arrest preceded by hemoptysis Arrived to ER awake with ROSC but worsening respiratory status Intubated and sats remained in 60s.  PCCM consulted for admission.  Pertinent  Medical History  HTN PCKD  Significant Hospital Events: Including procedures, antibiotic start and stop dates in addition to other pertinent events   12/31 admission  Interim History / Subjective:  Much better oxygenation. Not making much urine. Remains on veletri, NMB  Objective   Blood pressure 104/81, pulse 70, temperature 99.1 F (37.3 C), resp. rate (!) 28, height 5\' 11"  (1.803 m), weight 127.4 kg, SpO2 97 %. CVP:  [13 mmHg-15 mmHg] 13 mmHg  Vent Mode: PRVC FiO2 (%):  [50 %-100 %] 50 % Set Rate:  [18 bmp-28 bmp] 28 bmp Vt Set:  [600 mL] 600 mL PEEP:  [8 cmH20-22 cmH20] 8 cmH20 Plateau Pressure:  [22 cmH20-38 cmH20] 22 cmH20   Intake/Output Summary (Last 24 hours) at 01/15/2021 1129 Last data filed at 01/15/2021 1000 Gross per 24 hour  Intake 9785.58 ml  Output 1175 ml  Net 8610.58 ml    Filed Weights   02-12-2021 1630 01/15/21 0445  Weight: 129.6 kg 127.4 kg    Examination: Sedated/paralyzed Lungs suprisingly clear Paralyzed Developing anasarca Abdomen soft with hypoactive BS  Mg low being repleted Cr stable Resolved Hospital Problem list   N/a  Assessment & Plan:  ARDS secondary to massive aspiration event Cocaine positive: probably led to above, ?epistaxis vs. alveolar hemorrhage Shock- related to aspiration event OOH cardiac arrest presumed due to the aspiration event with ROSC and apparently neurologically fine per EDP prior to intubation Heavy smoker, heavy drinker Accidental carotid cannulation- pressure held at site, looks fine now, will get duplex Acute  kidney injury- in setting of above, stable but oliguric  - Wean veletri - ARDSnet protocol PEEP/FiO2 titration; limit DP to < 15 as able - f/u echo, carotid duplex - CT sinus/ chest/ neck then will need to scope to look for source of this bleed, concerned there may be an underlying mass given smoking history - Will have nurse reach out to family - Start trickle feeds  Best Practice (right click and "Reselect all SmartList Selections" daily)   Diet/type: NPO DVT prophylaxis: not indicated GI prophylaxis: PPI Lines: yes and it is still needed Foley:  Yes, and it is still needed Code Status:  full code Last date of multidisciplinary goals of care discussion [pending]  34 minutes crit care time independent of procedures  03/15/21 MD PCCM

## 2021-01-15 DEATH — deceased

## 2021-01-16 ENCOUNTER — Inpatient Hospital Stay (HOSPITAL_COMMUNITY): Payer: Medicaid Other

## 2021-01-16 DIAGNOSIS — J9601 Acute respiratory failure with hypoxia: Secondary | ICD-10-CM

## 2021-01-16 DIAGNOSIS — Z7189 Other specified counseling: Secondary | ICD-10-CM

## 2021-01-16 DIAGNOSIS — I63419 Cerebral infarction due to embolism of unspecified middle cerebral artery: Secondary | ICD-10-CM

## 2021-01-16 DIAGNOSIS — Z515 Encounter for palliative care: Secondary | ICD-10-CM

## 2021-01-16 DIAGNOSIS — R042 Hemoptysis: Secondary | ICD-10-CM

## 2021-01-16 DIAGNOSIS — F1410 Cocaine abuse, uncomplicated: Secondary | ICD-10-CM

## 2021-01-16 DIAGNOSIS — I6340 Cerebral infarction due to embolism of unspecified cerebral artery: Secondary | ICD-10-CM | POA: Insufficient documentation

## 2021-01-16 DIAGNOSIS — J80 Acute respiratory distress syndrome: Secondary | ICD-10-CM

## 2021-01-16 LAB — CBC
HCT: 37.9 % — ABNORMAL LOW (ref 39.0–52.0)
Hemoglobin: 13.3 g/dL (ref 13.0–17.0)
MCH: 31 pg (ref 26.0–34.0)
MCHC: 35.1 g/dL (ref 30.0–36.0)
MCV: 88.3 fL (ref 80.0–100.0)
Platelets: 134 10*3/uL — ABNORMAL LOW (ref 150–400)
RBC: 4.29 MIL/uL (ref 4.22–5.81)
RDW: 13.8 % (ref 11.5–15.5)
WBC: 13.8 10*3/uL — ABNORMAL HIGH (ref 4.0–10.5)
nRBC: 0 % (ref 0.0–0.2)

## 2021-01-16 LAB — BASIC METABOLIC PANEL
Anion gap: 8 (ref 5–15)
BUN: 22 mg/dL — ABNORMAL HIGH (ref 6–20)
CO2: 23 mmol/L (ref 22–32)
Calcium: 7.5 mg/dL — ABNORMAL LOW (ref 8.9–10.3)
Chloride: 108 mmol/L (ref 98–111)
Creatinine, Ser: 1.45 mg/dL — ABNORMAL HIGH (ref 0.61–1.24)
GFR, Estimated: 60 mL/min — ABNORMAL LOW (ref >60)
Glucose, Bld: 137 mg/dL — ABNORMAL HIGH (ref 70–99)
Potassium: 3.9 mmol/L (ref 3.5–5.1)
Sodium: 139 mmol/L (ref 135–145)

## 2021-01-16 LAB — PHOSPHORUS: Phosphorus: 3 mg/dL (ref 2.5–4.6)

## 2021-01-16 LAB — POCT I-STAT 7, (LYTES, BLD GAS, ICA,H+H)
Acid-Base Excess: 0 mmol/L (ref 0.0–2.0)
Bicarbonate: 24.3 mmol/L (ref 20.0–28.0)
Calcium, Ion: 1.14 mmol/L — ABNORMAL LOW (ref 1.15–1.40)
HCT: 35 % — ABNORMAL LOW (ref 39.0–52.0)
Hemoglobin: 11.9 g/dL — ABNORMAL LOW (ref 13.0–17.0)
O2 Saturation: 99 %
Patient temperature: 36.7
Potassium: 3.9 mmol/L (ref 3.5–5.1)
Sodium: 140 mmol/L (ref 135–145)
TCO2: 25 mmol/L (ref 22–32)
pCO2 arterial: 38.1 mmHg (ref 32.0–48.0)
pH, Arterial: 7.411 (ref 7.350–7.450)
pO2, Arterial: 154 mmHg — ABNORMAL HIGH (ref 83.0–108.0)

## 2021-01-16 LAB — GLUCOSE, CAPILLARY
Glucose-Capillary: 113 mg/dL — ABNORMAL HIGH (ref 70–99)
Glucose-Capillary: 115 mg/dL — ABNORMAL HIGH (ref 70–99)
Glucose-Capillary: 125 mg/dL — ABNORMAL HIGH (ref 70–99)
Glucose-Capillary: 128 mg/dL — ABNORMAL HIGH (ref 70–99)
Glucose-Capillary: 135 mg/dL — ABNORMAL HIGH (ref 70–99)
Glucose-Capillary: 135 mg/dL — ABNORMAL HIGH (ref 70–99)
Glucose-Capillary: 145 mg/dL — ABNORMAL HIGH (ref 70–99)

## 2021-01-16 LAB — SODIUM
Sodium: 142 mmol/L (ref 135–145)
Sodium: 146 mmol/L — ABNORMAL HIGH (ref 135–145)
Sodium: 147 mmol/L — ABNORMAL HIGH (ref 135–145)

## 2021-01-16 LAB — MAGNESIUM: Magnesium: 2.6 mg/dL — ABNORMAL HIGH (ref 1.7–2.4)

## 2021-01-16 IMAGING — MR MR MRA HEAD W/O CM
3 series · 19 of 48 positions shown · non-contrast
Comparison: Head CT 1 day ago

CLINICAL DATA: Stroke follow-up, determine embolic source.



[Series 4: ax (id) · axial · 1.0mm · 0.43mm/px · z∈[-82,+1]mm · 17 of 176 slices shown]
[im 1/176]
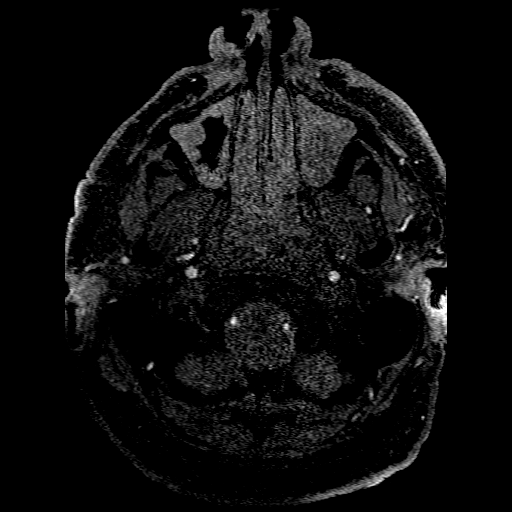
[im 4/176]
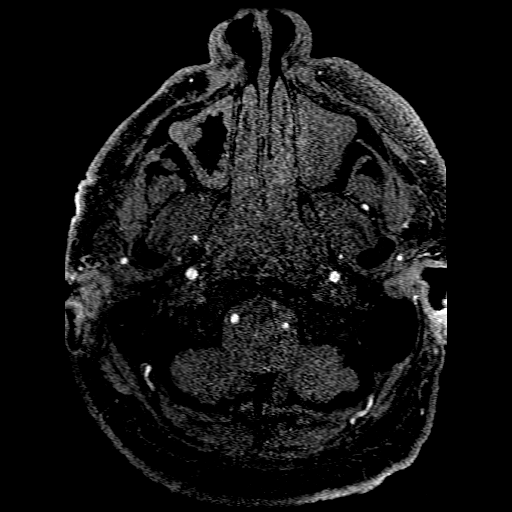
[im 8/176]
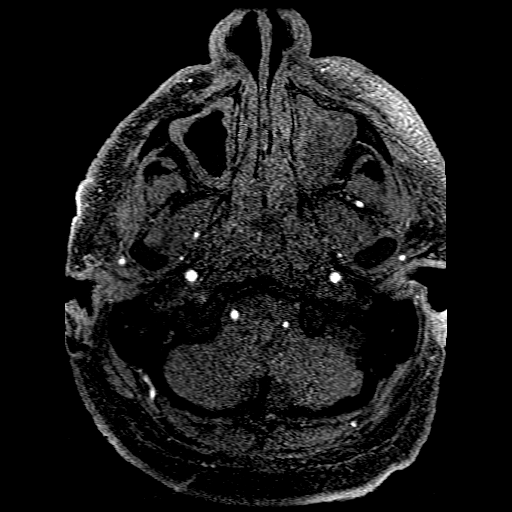
[im 12/176]
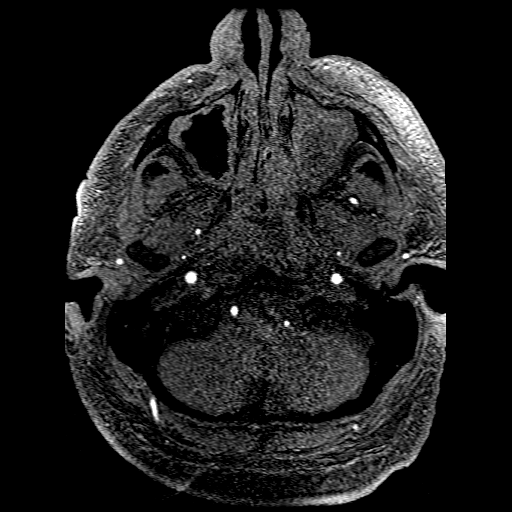
[im 16/176]
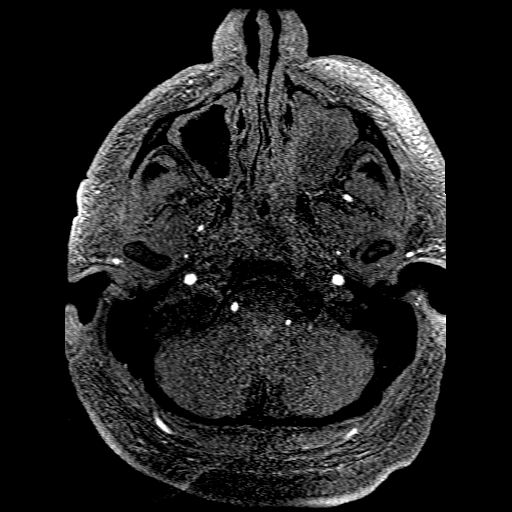
[im 20/176]
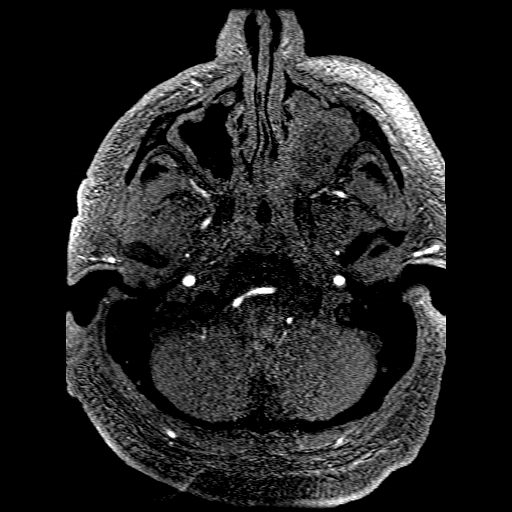
[im 24/176]
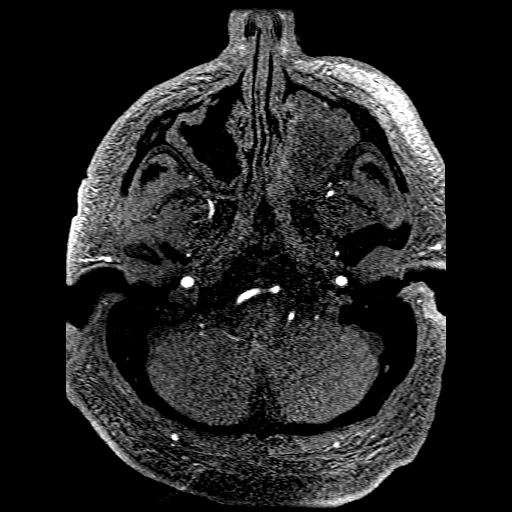
[im 28/176]
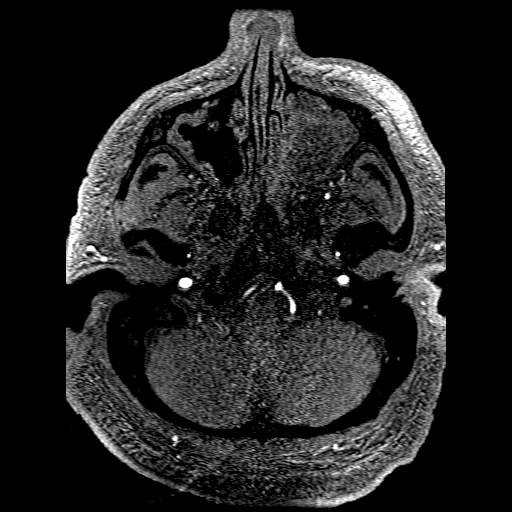
[im 32/176]
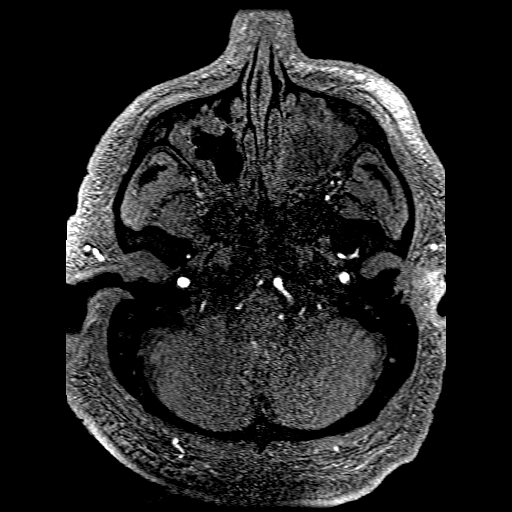
[im 55/176]
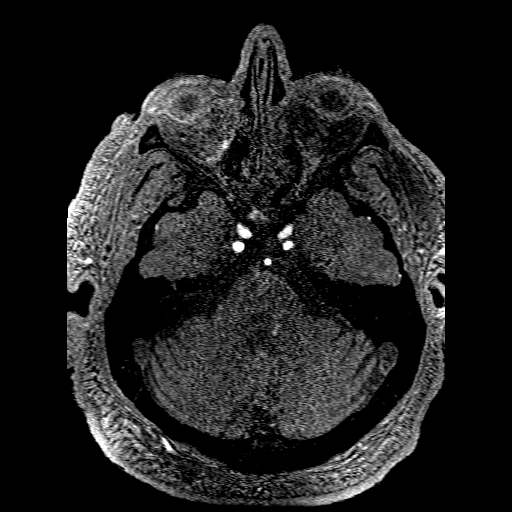
[im 78/176]
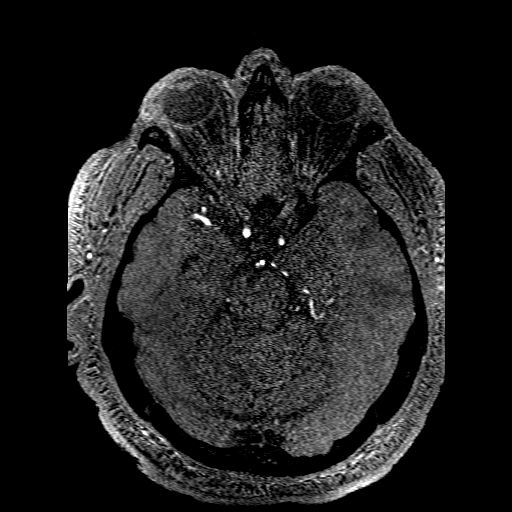
[im 90/176]
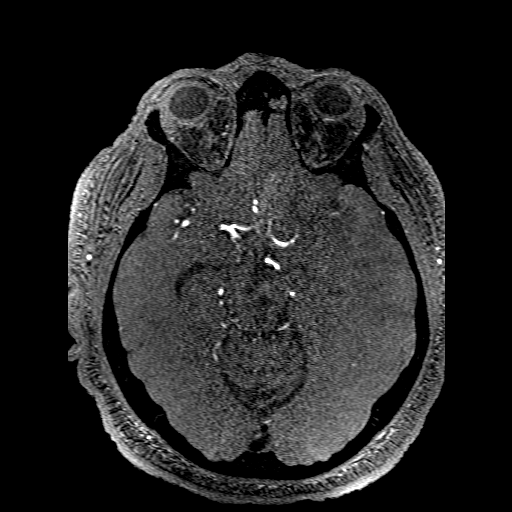
[im 98/176]
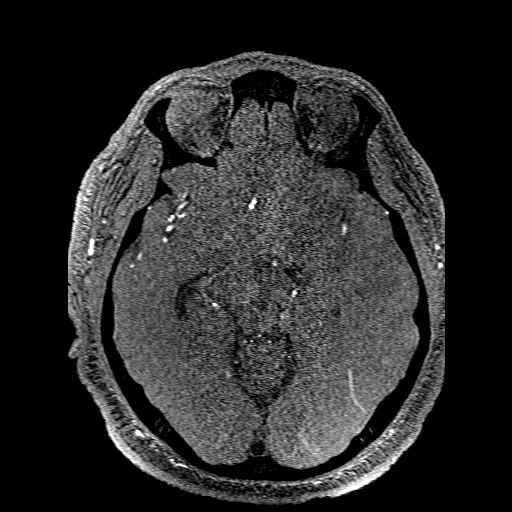
[im 121/176]
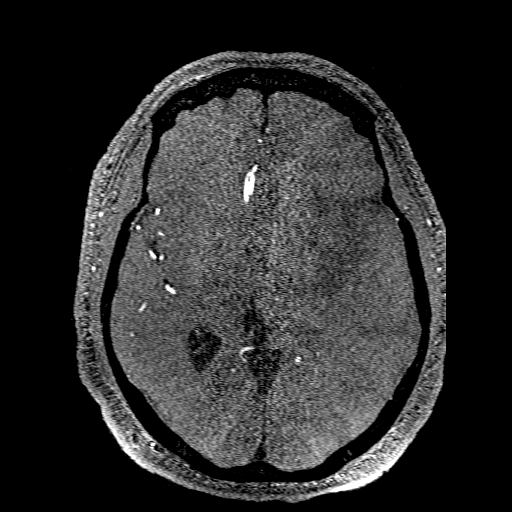
[im 144/176]
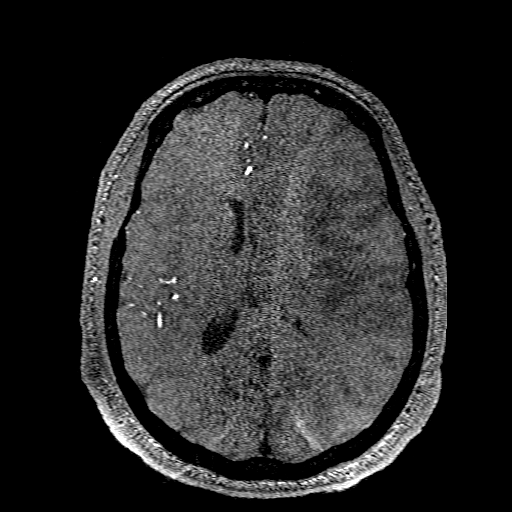
[im 148/176]
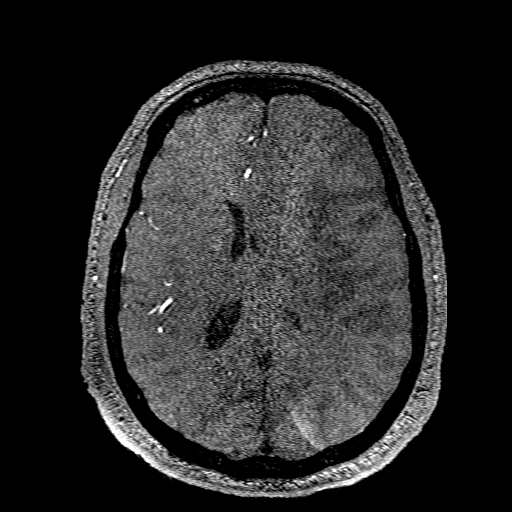
[im 168/176]
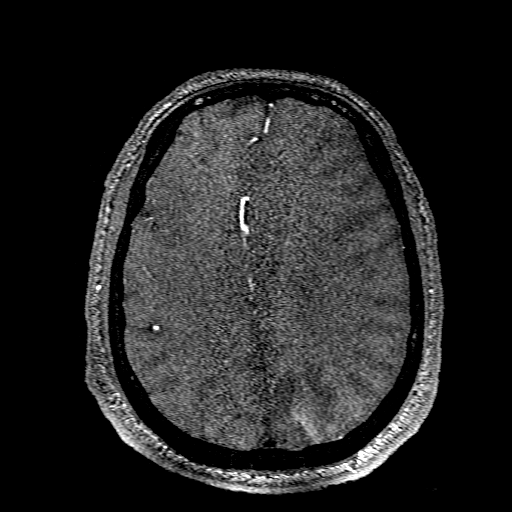

[Series 400: col:ax (id) · axial · 1.0mm · 0.43mm/px · 1 of 1 slices shown]
[im 1/1]
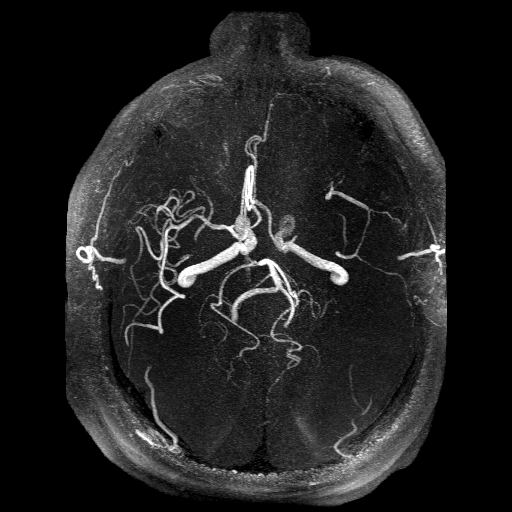

[Series 401: pjn:ax (id) · sagittal · 1.0mm · 0.43mm/px · 1 of 3 slices shown]
[im 1/3]
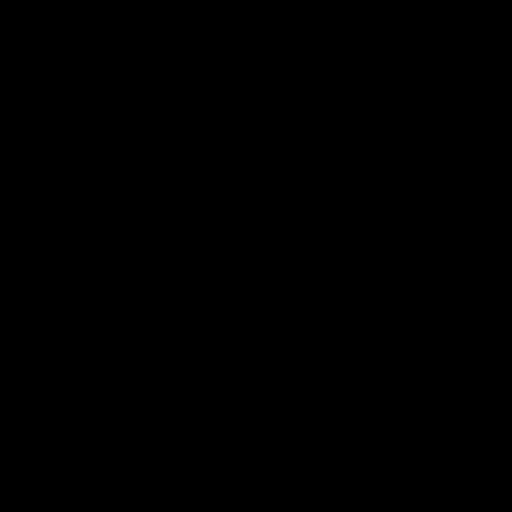

[19 of 48 positions shown; findings below may reference images not displayed]

FINDINGS: MRI HEAD FINDINGS

Brain: Confluent restricted diffusion in the left MCA territory
which extends to a degree into the upper occipital lobe. Small
patchy infarcts along the right cerebral convexity. No hemorrhage,
hydrocephalus, or tumor like finding. Cytotoxic edema causes midline
shift of 7 mm. No entrapment. No pre-existing infarct.

Vascular: See below

Skull and upper cervical spine: Normal marrow signal

Sinuses/Orbits: Mucosal thickening with central debris at the left
maxillary sinus.

MRA HEAD FINDINGS

The carotid arteries are smoothly contoured and widely patent. Left
M1 occlusion with faint M2 branch reconstitution. No additional
proximal embolism is seen. The vertebral and basilar arteries are
smoothly contoured and widely patent. Fetal type right PCA.

MRA NECK FINDINGS

The covered major vessels are widely patent with no ulceration or
beading. Antegrade arterial flow in the neck. The aorta is was not
covered
IMPRESSION: Brain MRI:

1. Acute, complete left MCA territory infarct. Cytotoxic edema
causes 7 mm of midline shift.
2. Small acute cortical infarcts at the right cerebral convexity.

Intracranial MRA:

Occluded left MCA.  The other intracranial vessels are unremarkable.

Neck MRA:

Unremarkable.

## 2021-01-16 IMAGING — MR MR HEAD W/O CM
6 of 10 series · 27 of 48 positions shown · non-contrast
Comparison: Head CT 1 day ago

CLINICAL DATA: Stroke follow-up, determine embolic source.



[Series 3: DWI · axial · 3.0mm · 0.94mm/px · z∈[-96,+57]mm · 8 of 104 slices shown (1 of 2)]
[im 1/104]
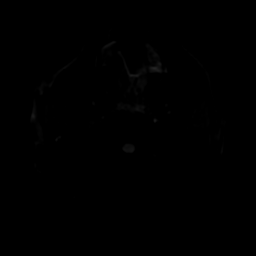
[im 12/104]
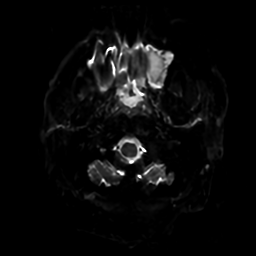
[im 35/104]
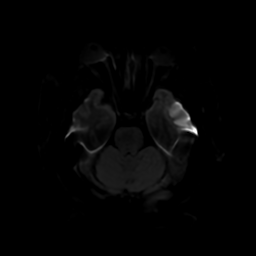
[im 46/104]
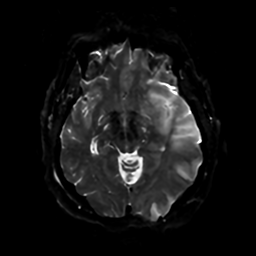
[im 58/104]
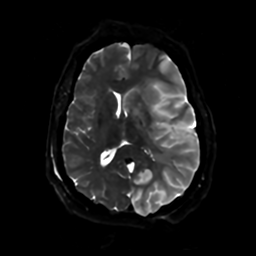
[im 69/104]
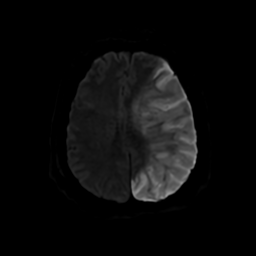
[im 92/104]
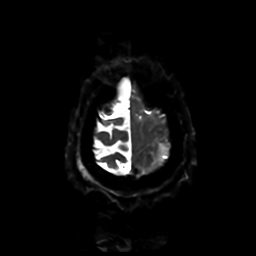
[im 104/104]
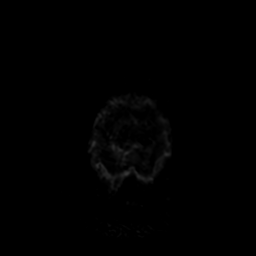

[Series 7: DWI · coronal · 4.0mm · 0.94mm/px · 6 of 72 slices shown (2 of 2)]
[im 1/72]
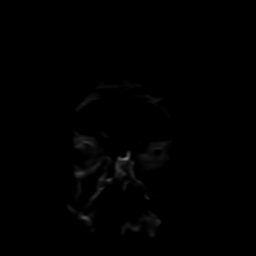
[im 15/72]
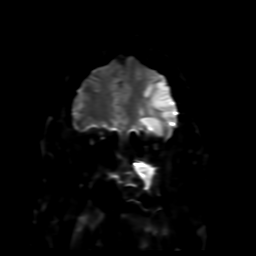
[im 29/72]
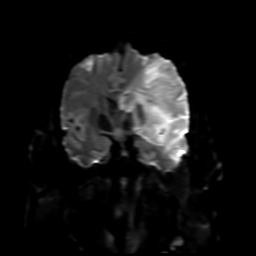
[im 43/72]
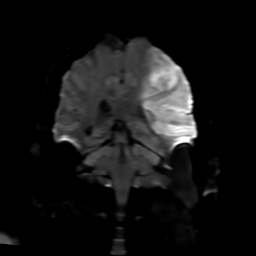
[im 57/72]
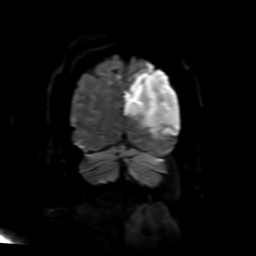
[im 72/72]
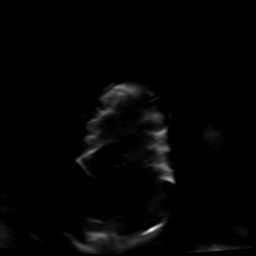

[Series 8: FLAIR · sagittal · 5.0mm · 0.23mm/px · 2 of 23 slices shown (1 of 2)]
[im 1/23]
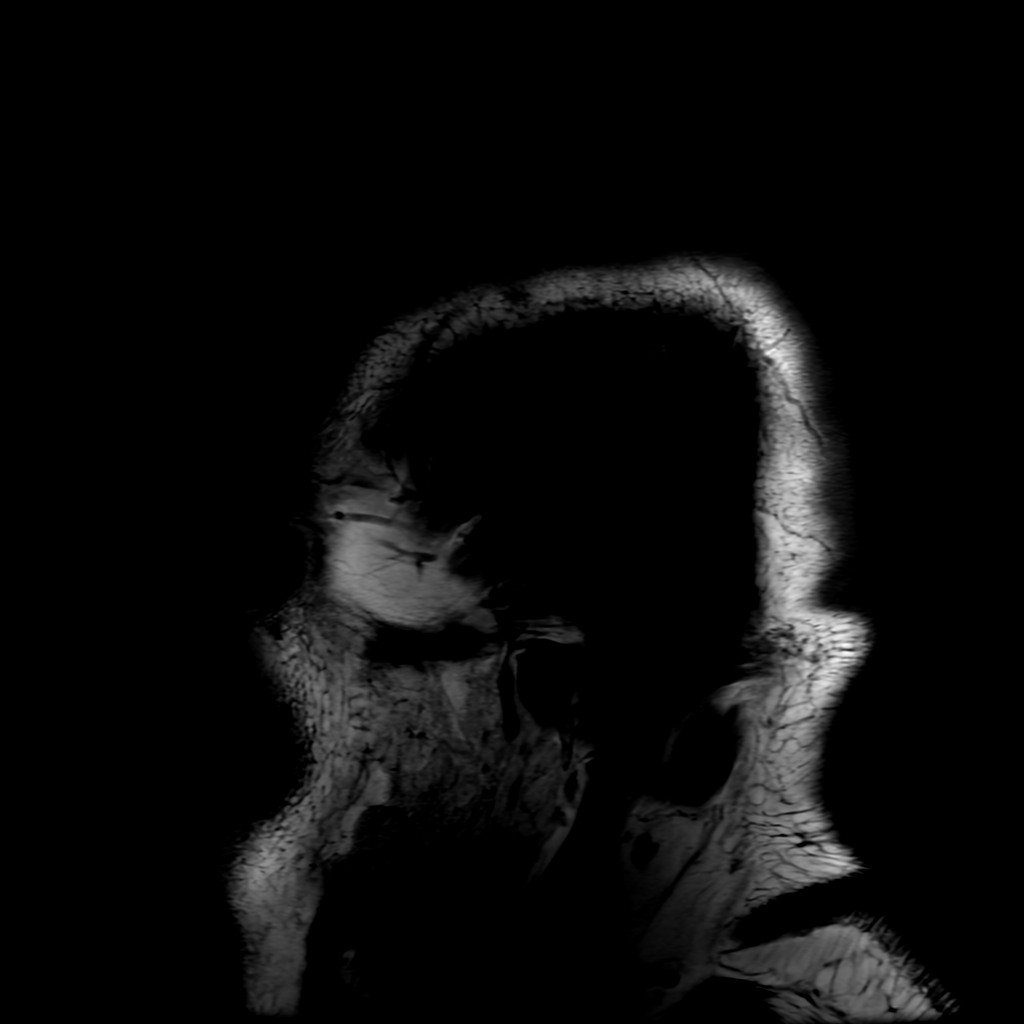
[im 23/23]
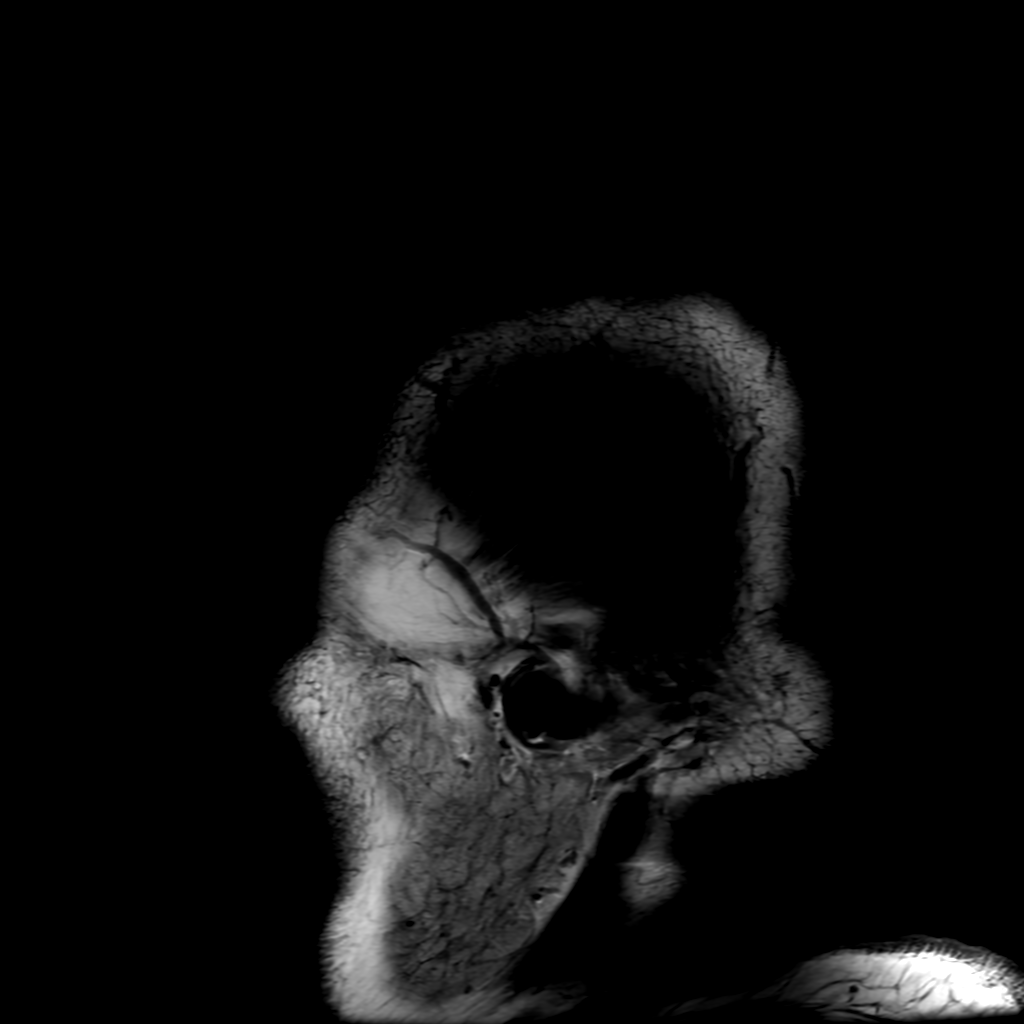

[Series 10: FLAIR · axial · 4.0mm · 0.45mm/px · z∈[-94,+60]mm · 3 of 36 slices shown (2 of 2)]
[im 1/36]
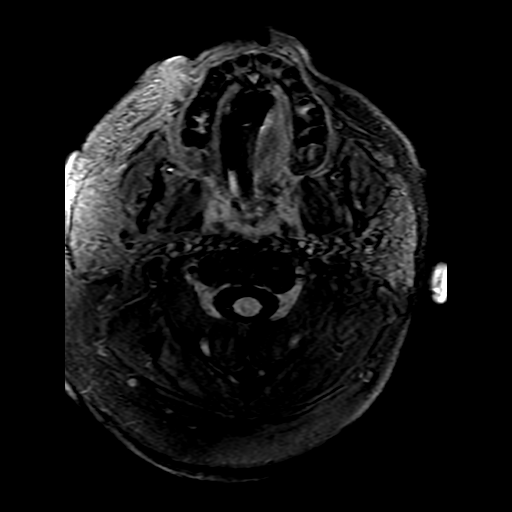
[im 18/36]
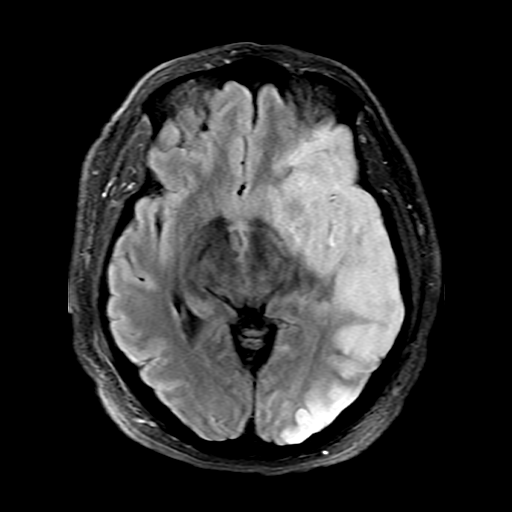
[im 36/36]
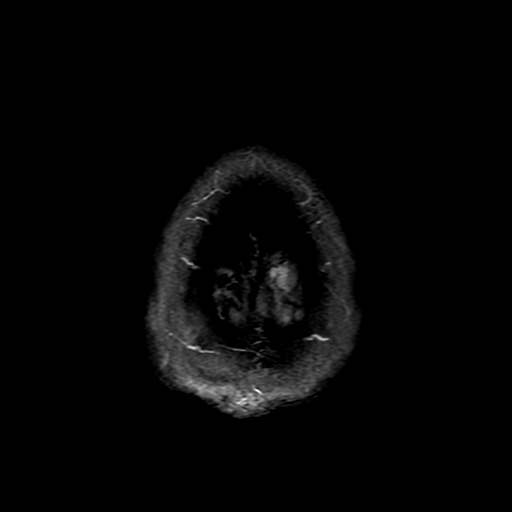

[Series 350: ADC · axial · 3.0mm · 0.94mm/px · z∈[-96,+57]mm · 5 of 52 slices shown (1 of 2)]
[im 1/52]
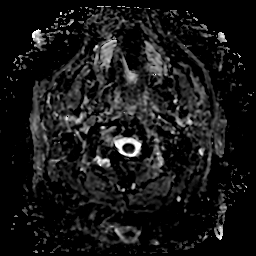
[im 13/52]
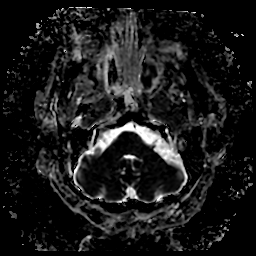
[im 26/52]
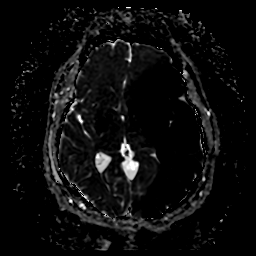
[im 39/52]
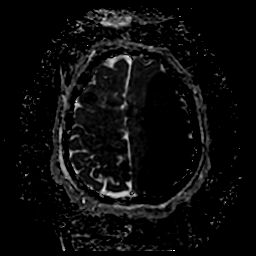
[im 52/52]
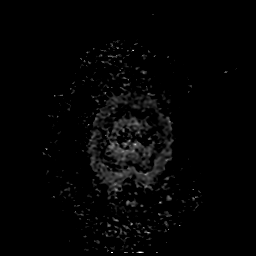

[Series 750: ADC · coronal · 4.0mm · 0.94mm/px · 3 of 37 slices shown (2 of 2)]
[im 1/37]
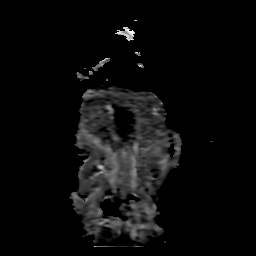
[im 19/37]
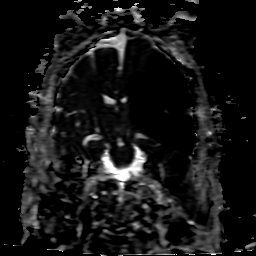
[im 37/37]
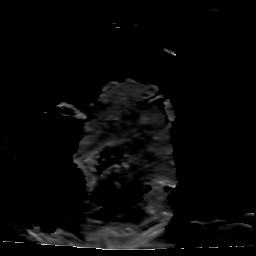

[27 of 48 positions shown; findings below may reference images not displayed]

FINDINGS: MRI HEAD FINDINGS

Brain: Confluent restricted diffusion in the left MCA territory
which extends to a degree into the upper occipital lobe. Small
patchy infarcts along the right cerebral convexity. No hemorrhage,
hydrocephalus, or tumor like finding. Cytotoxic edema causes midline
shift of 7 mm. No entrapment. No pre-existing infarct.

Vascular: See below

Skull and upper cervical spine: Normal marrow signal

Sinuses/Orbits: Mucosal thickening with central debris at the left
maxillary sinus.

MRA HEAD FINDINGS

The carotid arteries are smoothly contoured and widely patent. Left
M1 occlusion with faint M2 branch reconstitution. No additional
proximal embolism is seen. The vertebral and basilar arteries are
smoothly contoured and widely patent. Fetal type right PCA.

MRA NECK FINDINGS

The covered major vessels are widely patent with no ulceration or
beading. Antegrade arterial flow in the neck. The aorta is was not
covered
IMPRESSION: Brain MRI:

1. Acute, complete left MCA territory infarct. Cytotoxic edema
causes 7 mm of midline shift.
2. Small acute cortical infarcts at the right cerebral convexity.

Intracranial MRA:

Occluded left MCA.  The other intracranial vessels are unremarkable.

Neck MRA:

Unremarkable.

## 2021-01-16 IMAGING — DX DG ABD PORTABLE 1V
1 series · 1 of 1 positions shown · non-contrast
Comparison: [DATE].

CLINICAL DATA: Feeding tube placement.

EXAM:
PORTABLE ABDOMEN - 1 VIEW

[abdomen]
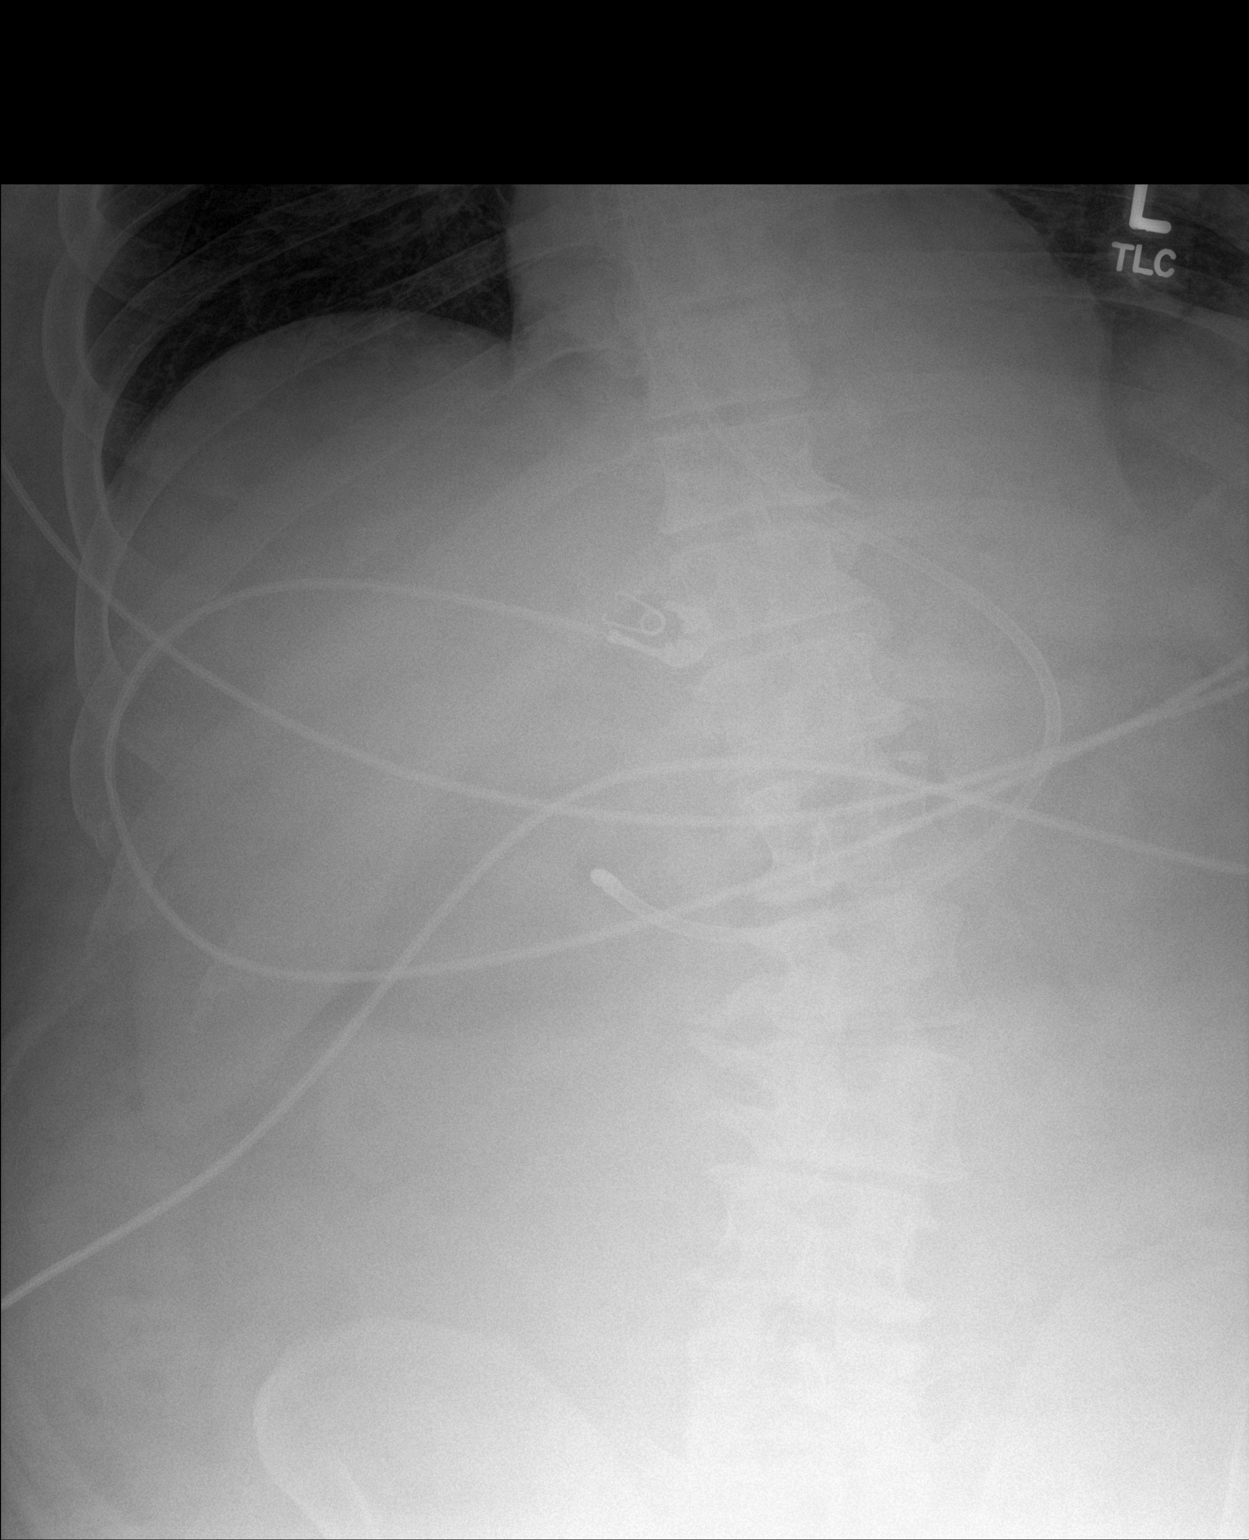

[1 of 1 positions shown; findings below may reference images not displayed]

FINDINGS: The bowel gas pattern is normal. Distal tip of feeding tube is seen
in expected position of distal stomach. No radio-opaque calculi or
other significant radiographic abnormality are seen.
IMPRESSION: Distal tip of feeding tube seen in expected position of distal
stomach.

## 2021-01-16 IMAGING — MR MR MRA NECK W/O CM
2 series · 19 of 48 positions shown · non-contrast
Comparison: Head CT 1 day ago

CLINICAL DATA: Stroke follow-up, determine embolic source.



[Series 6: TOF · axial · 2.4mm · 0.47mm/px · z∈[-277,-106]mm · 14 of 152 slices shown (1 of 2)]
[im 1/152]
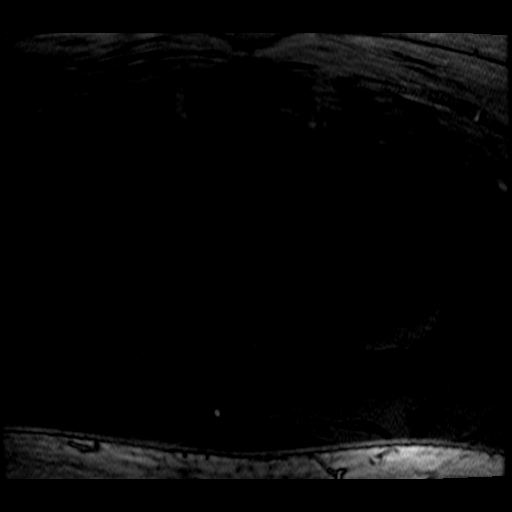
[im 4/152]
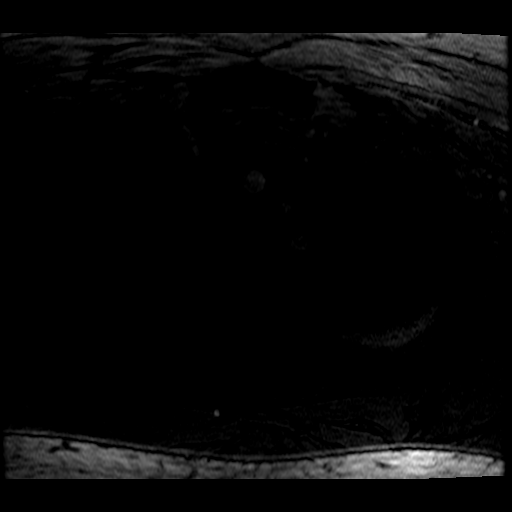
[im 8/152]
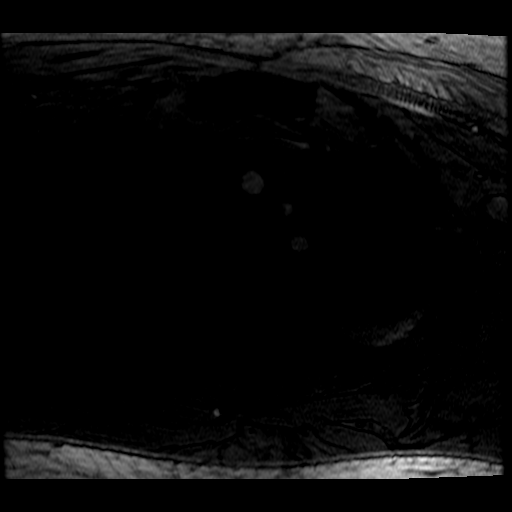
[im 11/152]
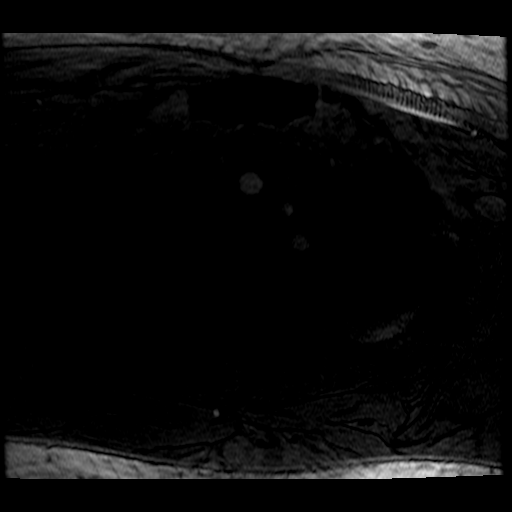
[im 26/152]
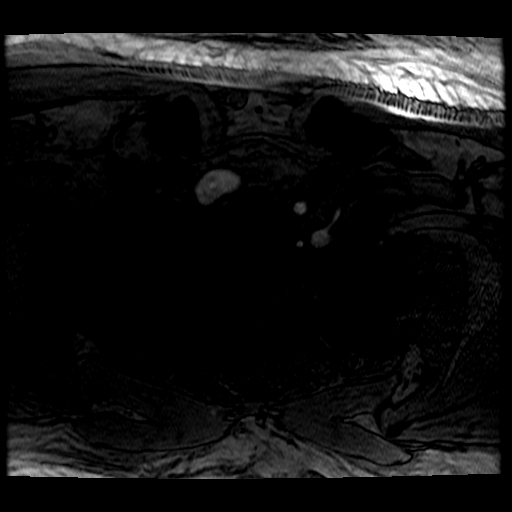
[im 29/152]
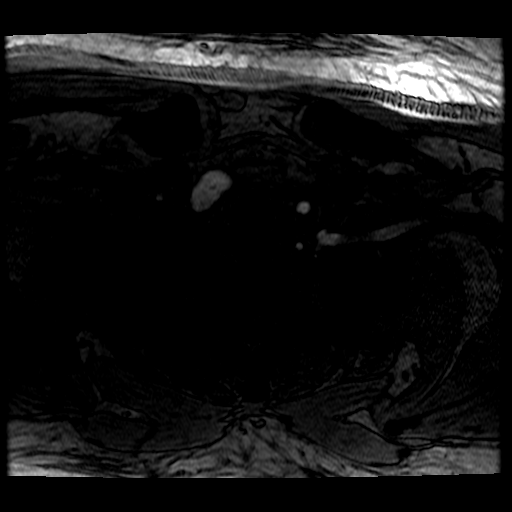
[im 47/152]
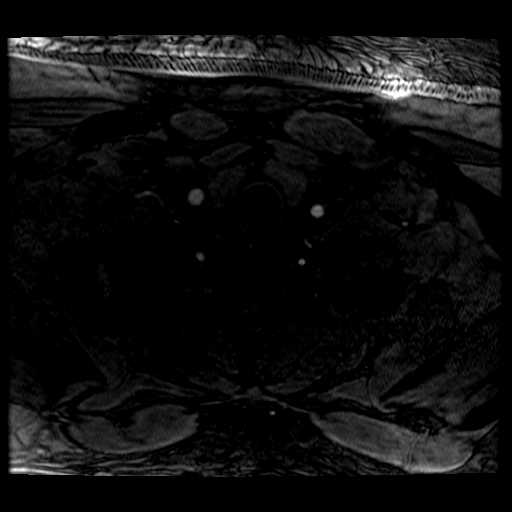
[im 65/152]
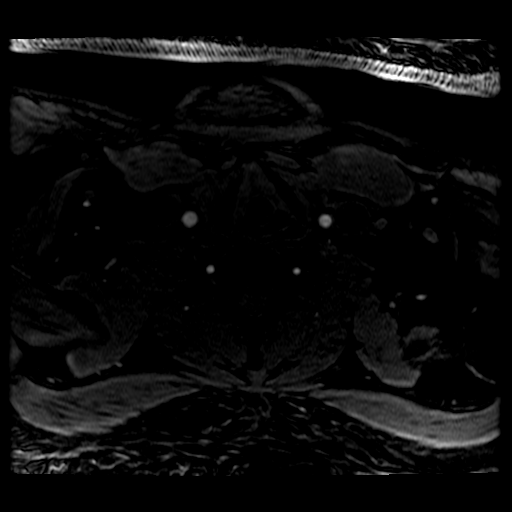
[im 76/152]
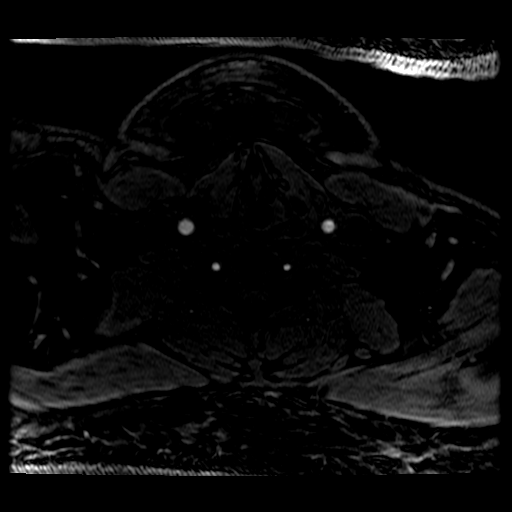
[im 87/152]
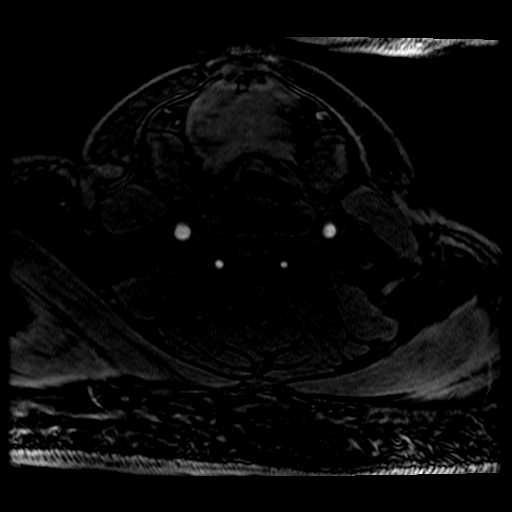
[im 105/152]
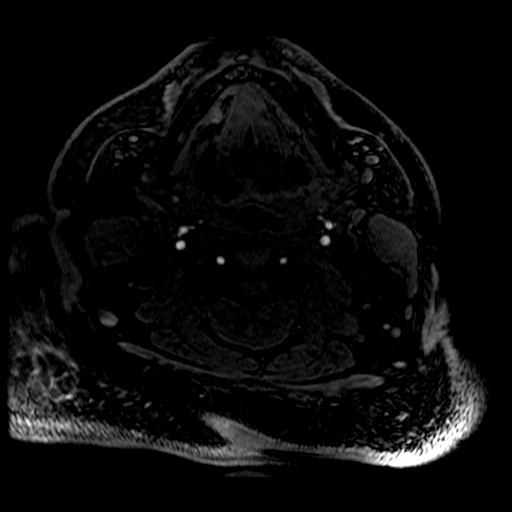
[im 123/152]
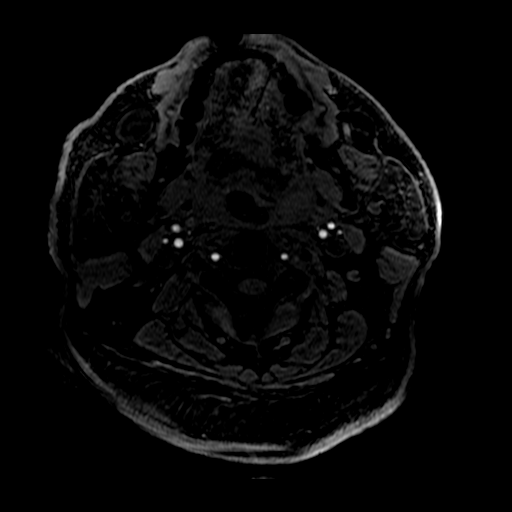
[im 126/152]
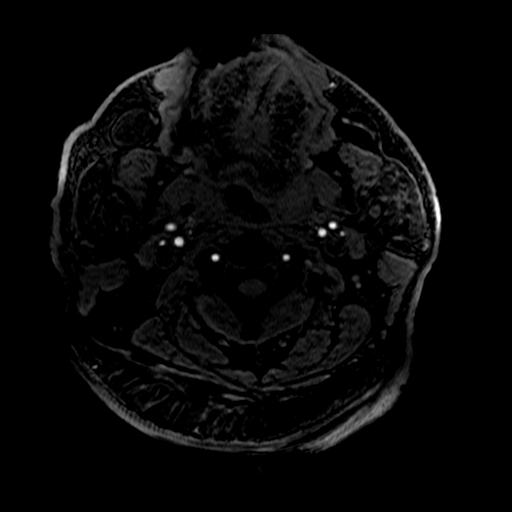
[im 144/152]
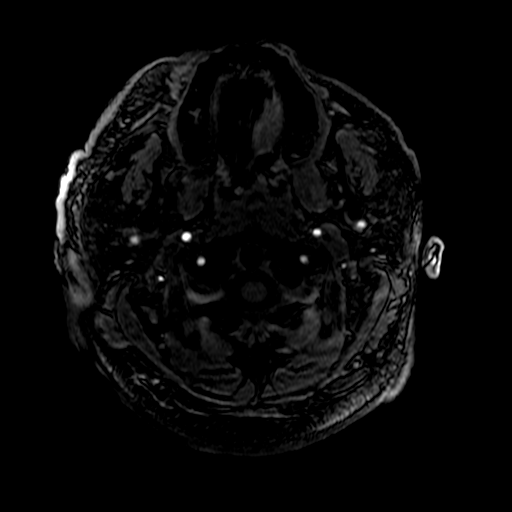

[Series 601: TOF · sagittal · 2.4mm · 0.47mm/px · 5 of 19 slices shown (2 of 2)]
[im 1/19]
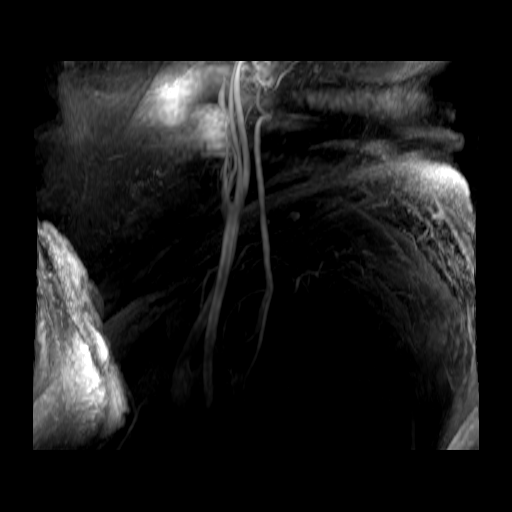
[im 5/19]
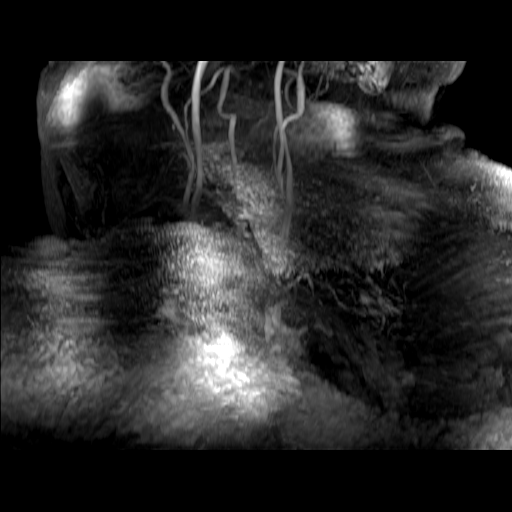
[im 10/19]
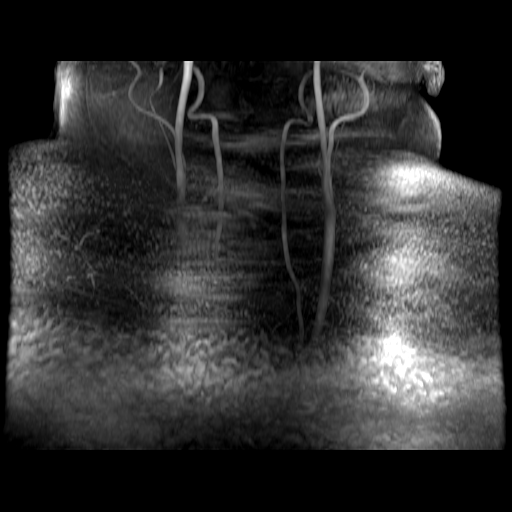
[im 14/19]
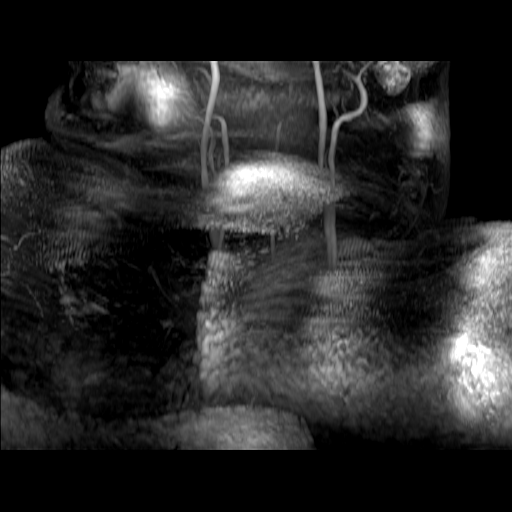
[im 19/19]
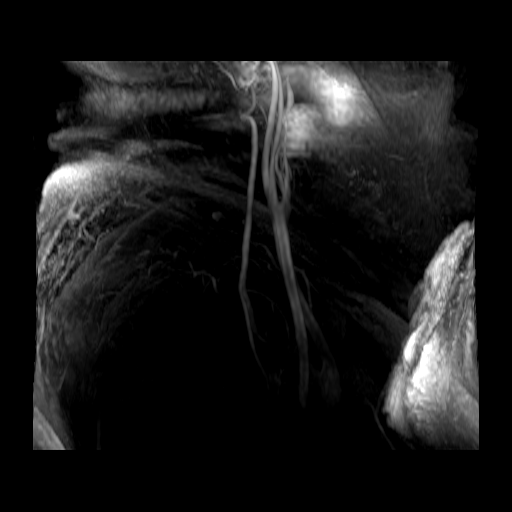

[19 of 48 positions shown; findings below may reference images not displayed]

FINDINGS: MRI HEAD FINDINGS

Brain: Confluent restricted diffusion in the left MCA territory
which extends to a degree into the upper occipital lobe. Small
patchy infarcts along the right cerebral convexity. No hemorrhage,
hydrocephalus, or tumor like finding. Cytotoxic edema causes midline
shift of 7 mm. No entrapment. No pre-existing infarct.

Vascular: See below

Skull and upper cervical spine: Normal marrow signal

Sinuses/Orbits: Mucosal thickening with central debris at the left
maxillary sinus.

MRA HEAD FINDINGS

The carotid arteries are smoothly contoured and widely patent. Left
M1 occlusion with faint M2 branch reconstitution. No additional
proximal embolism is seen. The vertebral and basilar arteries are
smoothly contoured and widely patent. Fetal type right PCA.

MRA NECK FINDINGS

The covered major vessels are widely patent with no ulceration or
beading. Antegrade arterial flow in the neck. The aorta is was not
covered
IMPRESSION: Brain MRI:

1. Acute, complete left MCA territory infarct. Cytotoxic edema
causes 7 mm of midline shift.
2. Small acute cortical infarcts at the right cerebral convexity.

Intracranial MRA:

Occluded left MCA.  The other intracranial vessels are unremarkable.

Neck MRA:

Unremarkable.

## 2021-01-16 IMAGING — DX DG CHEST 1V PORT
2 series · 2 of 2 positions shown · non-contrast
Comparison: [DATE].

CLINICAL DATA: ARDS.

EXAM:
PORTABLE CHEST 1 VIEW

[chest ap (1 of 2)]
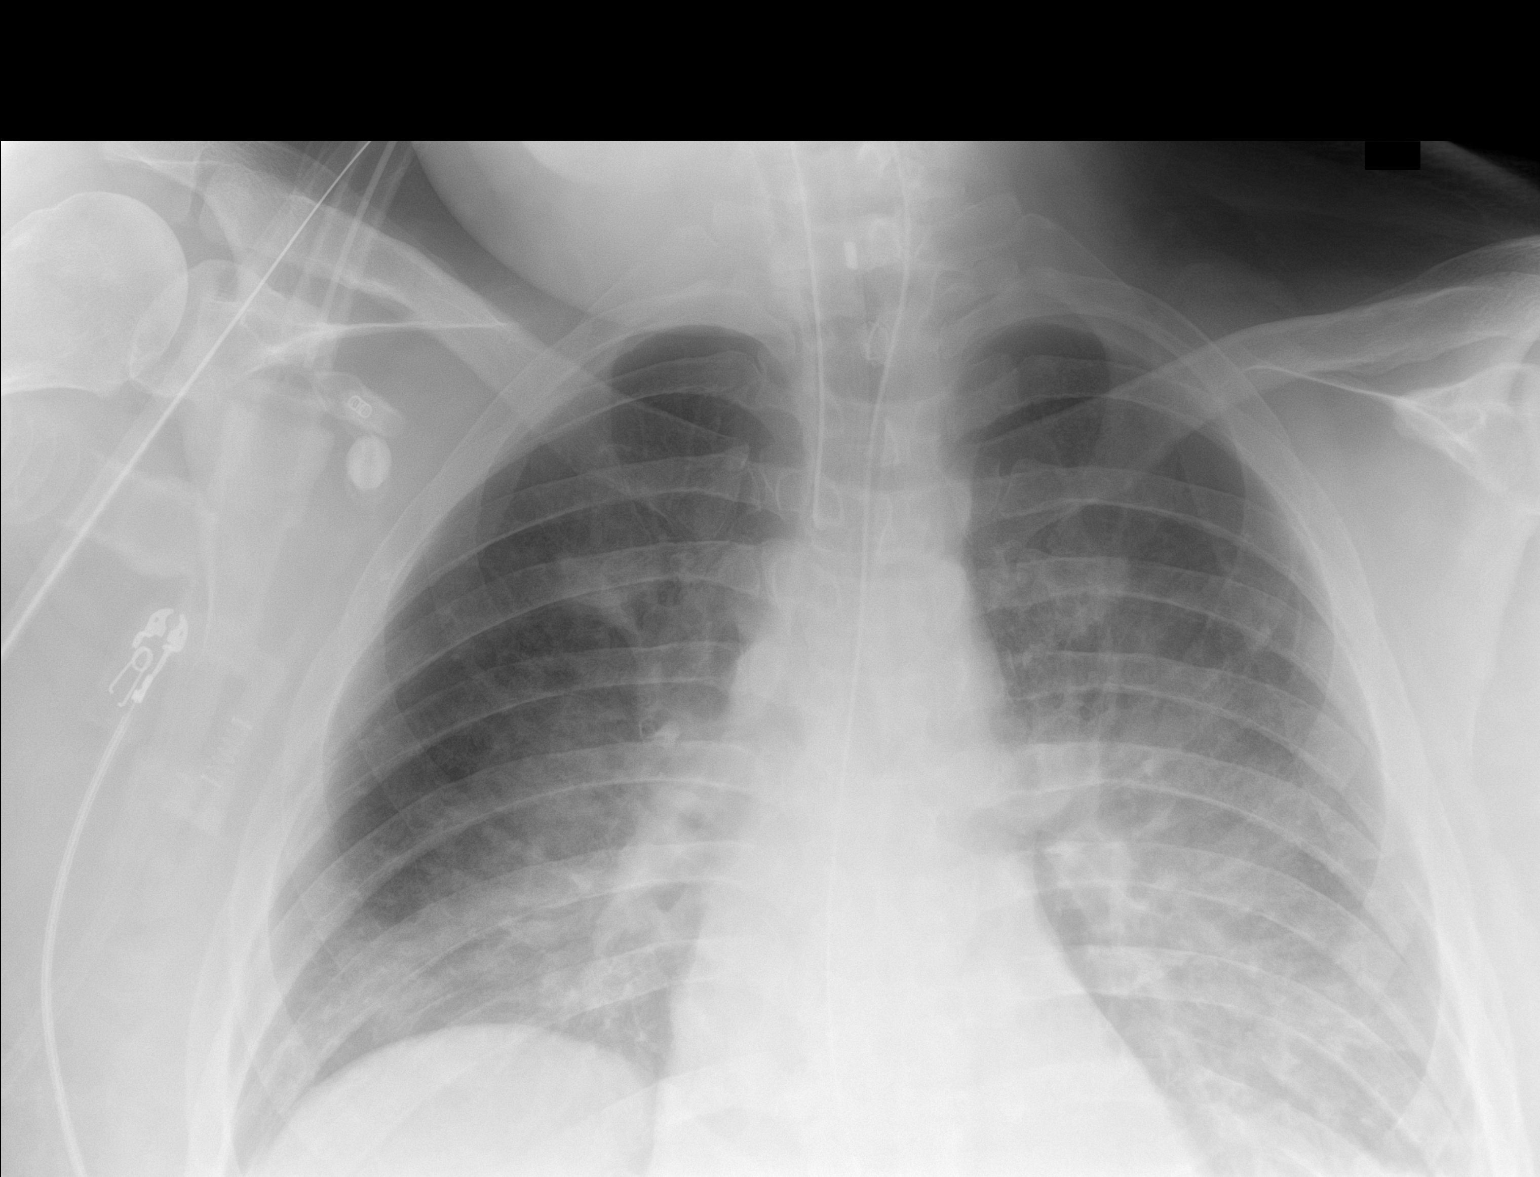

[chest ap (2 of 2)]
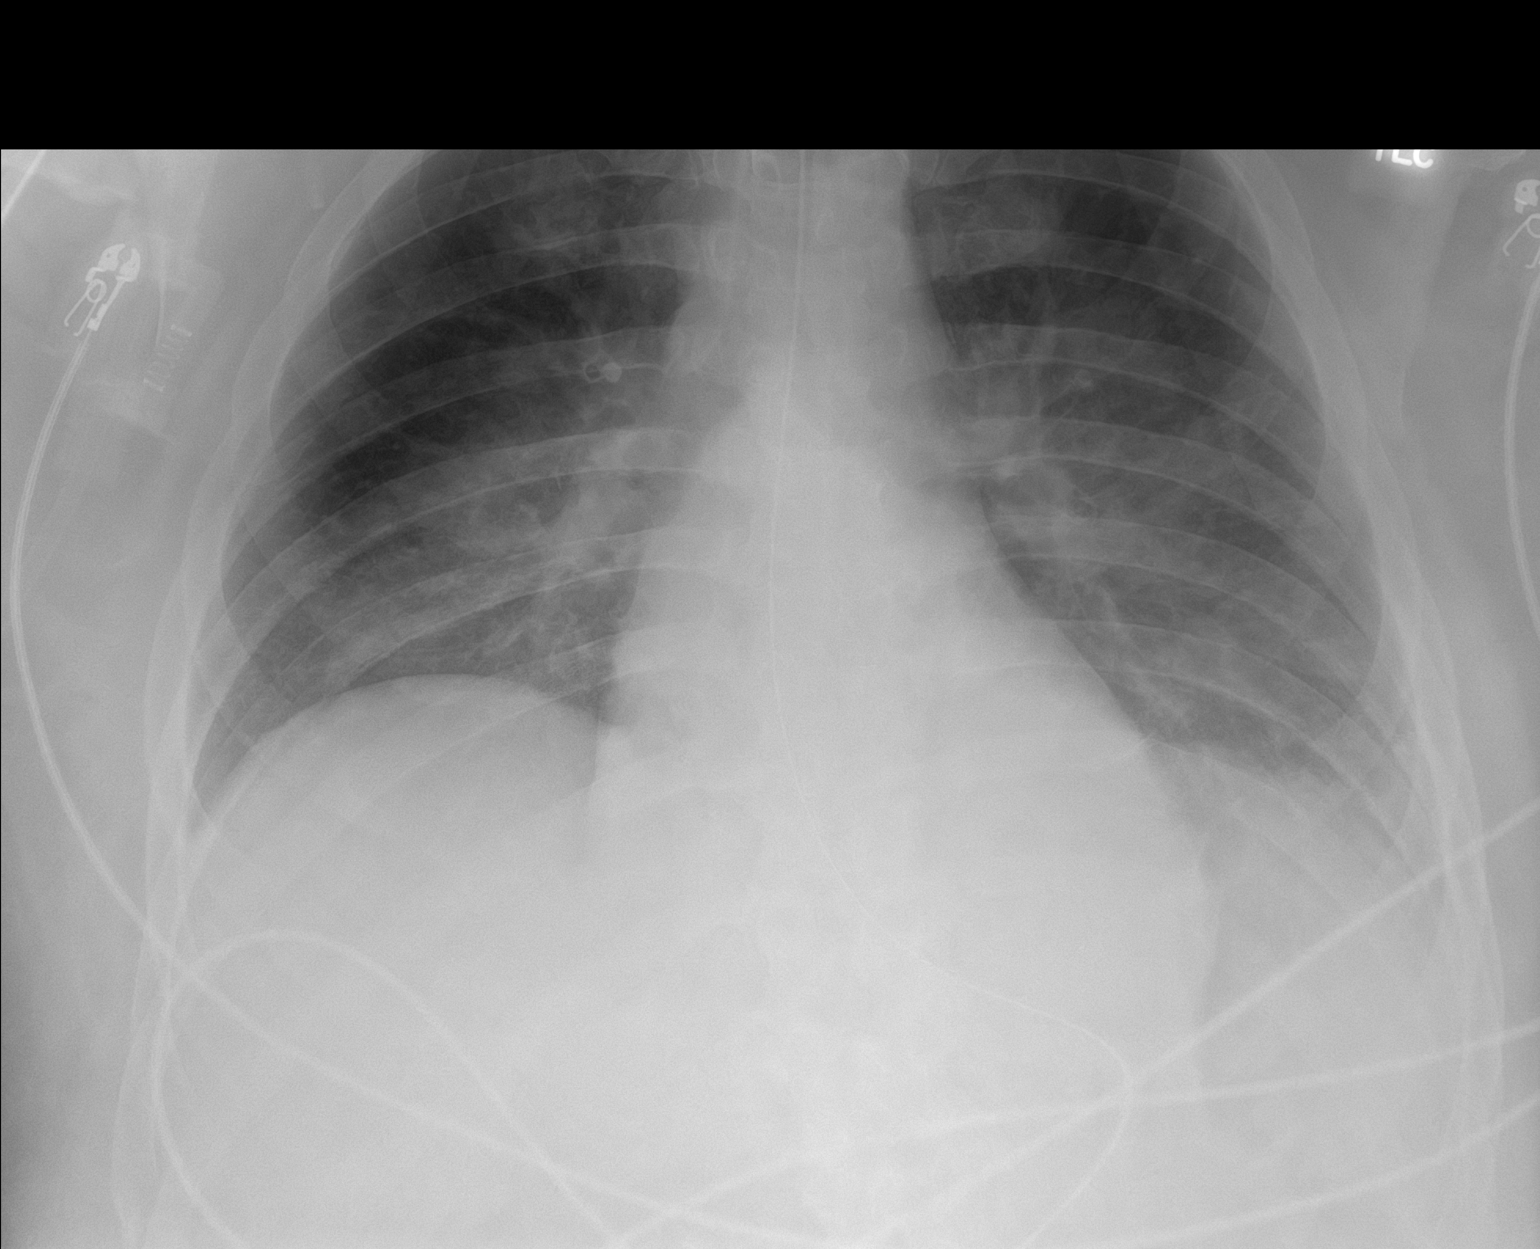

[2 of 2 positions shown; findings below may reference images not displayed]

FINDINGS: The heart size and mediastinal contours are within normal limits.
Endotracheal and nasogastric tubes are unchanged in position. Stable
bilateral lung opacities are noted concerning for edema or
pneumonia. The visualized skeletal structures are unremarkable.
IMPRESSION: Stable support apparatus.  Stable bilateral lung opacities.

## 2021-01-16 MED ORDER — PROSOURCE TF PO LIQD
90.0000 mL | Freq: Four times a day (QID) | ORAL | Status: DC
  Administered 2021-01-16 – 2021-01-18 (×11): 90 mL
  Filled 2021-01-16 (×11): qty 90

## 2021-01-16 MED ORDER — SODIUM CHLORIDE 3 % IV SOLN
INTRAVENOUS | Status: DC
  Administered 2021-01-16: 100 mL/h via INTRAVENOUS
  Filled 2021-01-16 (×7): qty 500

## 2021-01-16 MED ORDER — POTASSIUM CHLORIDE 20 MEQ PO PACK
40.0000 meq | PACK | Freq: Once | ORAL | Status: AC
  Administered 2021-01-16: 40 meq
  Filled 2021-01-16: qty 2

## 2021-01-16 MED ORDER — THIAMINE HCL 100 MG PO TABS
100.0000 mg | ORAL_TABLET | Freq: Every day | ORAL | Status: DC
  Administered 2021-01-16 – 2021-01-18 (×3): 100 mg
  Filled 2021-01-16 (×3): qty 1

## 2021-01-16 MED ORDER — FOLIC ACID 1 MG PO TABS
1.0000 mg | ORAL_TABLET | Freq: Every day | ORAL | Status: DC
  Administered 2021-01-16 – 2021-01-18 (×3): 1 mg
  Filled 2021-01-16 (×3): qty 1

## 2021-01-16 MED ORDER — FUROSEMIDE 10 MG/ML IJ SOLN
40.0000 mg | Freq: Three times a day (TID) | INTRAMUSCULAR | Status: AC
  Administered 2021-01-16 (×3): 40 mg via INTRAVENOUS
  Filled 2021-01-16 (×3): qty 4

## 2021-01-16 MED ORDER — NOREPINEPHRINE 16 MG/250ML-% IV SOLN
0.0000 ug/min | INTRAVENOUS | Status: DC
  Administered 2021-01-16: 4 ug/min via INTRAVENOUS
  Filled 2021-01-16: qty 250

## 2021-01-16 MED ORDER — VITAL HIGH PROTEIN PO LIQD
1000.0000 mL | ORAL | Status: DC
  Administered 2021-01-16 – 2021-01-18 (×4): 1000 mL

## 2021-01-16 MED ORDER — CHLORHEXIDINE GLUCONATE CLOTH 2 % EX PADS
6.0000 | MEDICATED_PAD | Freq: Every day | CUTANEOUS | Status: DC
  Administered 2021-01-16 – 2021-01-18 (×3): 6 via TOPICAL

## 2021-01-16 NOTE — Procedures (Signed)
Cortrak  Tube Type:  Cortrak - 43 inches Tube Location:  Left nare Initial Placement:  Stomach Secured by: Bridle Technique Used to Measure Tube Placement:  Marking at nare/corner of mouth Cortrak Secured At:  80 cm   Cortrak Tube Team Note:  Consult received to place a Cortrak feeding tube.   X-ray is required, abdominal x-ray has been ordered by the Cortrak team. Please confirm tube placement before using the Cortrak tube.   If the tube becomes dislodged please keep the tube and contact the Cortrak team at [email or web address] ([email or web address]) for replacement.  If after hours and replacement cannot be delayed, place a NG tube and confirm placement with an abdominal x-ray.    Mozel Burdett MS, RD, LDN Please refer to AMION for RD and/or RD on-call/weekend/after hours pager   

## 2021-01-16 NOTE — Plan of Care (Signed)
°  Problem: Clinical Measurements: Goal: Ability to maintain clinical measurements within normal limits will improve 01/16/2021 1031 by Malva Limes, RN Outcome: Not Progressing 01/16/2021 1031 by Malva Limes, RN Outcome: Progressing   Problem: Clinical Measurements: Goal: Diagnostic test results will improve 01/16/2021 1031 by Malva Limes, RN Outcome: Not Progressing 01/16/2021 1031 by Malva Limes, RN Outcome: Progressing   Problem: Clinical Measurements: Goal: Cardiovascular complication will be avoided 01/16/2021 1031 by Malva Limes, RN Outcome: Not Progressing 01/16/2021 1031 by Malva Limes, RN Outcome: Progressing

## 2021-01-16 NOTE — Progress Notes (Signed)
Stroke Team Progress Note  SUBJECTIVE Patient remains sedated with neuromuscular blockade due to ventilator dyssynchrony.  Neurological exam exam is limited.  Eyes are closed.  Pupils are 2 mm very sluggishly reactive.  Also movements are absent.  Corneal flexes absent.  Weak cough and gag.  No spontaneous extremity movements and no response to painful stimuli. MRI scan of the brain showed a large left middle cerebral artery infarct with centimeter left-to-right shift. Underlying additional changes from hypoxemic brain insult cannot be ruled out MR angiogram of the brain shows left M1 occlusion.  Echocardiogram shows diminished ejection fraction of 45 to 50% with global hypokinesis.  Urine drug screen is positive for marijuana and cocaine. No family at the bedside. OBJECTIVE Most recent Vital Signs: Temp: 97.5 F (36.4 C) (01/02 1200) Temp Source: Bladder (01/02 1200) BP: 112/62 (01/02 1200) Pulse Rate: 69 (01/02 0816) Respiratory Rate: (!) 28 O2 Saturdation: 99%  CBG (last 3)  Recent Labs    01/15/21 2304 01/16/21 0812 01/16/21 1144  GLUCAP 135* 113* 128*       Studies:  CT Head large left MCA infarct also involving occipital lobe with 5 mm left-to-right shift. MRI Brain complete left MCA territory infarct including left occipital lobe with cytotoxic edema with 7 mm left-to-right midline shift.  Additionally small acute cortical infarcts in the right cerebral convexity. MRA*s occluded left middle cerebral artery.  MRI of the neck unremarkable ECHO : Diminished left ventricular ejection fraction 45 to 50% with mild global hypokinesia.  No definite clot. LDL pending HbA1c 5.8 Urine drug screen positive for cocaine and marijuana  Physical Exam:   Middle-aged male who is intubated sedated and paralyzed  . Afebrile. Head is nontraumatic. Neck is supple without bruit.    Cardiac exam no murmur or gallop. Lungs are clear to auscultation. Distal pulses are well felt.  Neurological Exam  :  Patient is sedated intubated with neuromuscular blockade.  Eyes are closed.  Pupils are 2 mm very sluggishly reactive.  Corneal reflexes are absent bilaterally.  Also movements are absent.  Weak cough and gag.  No spontaneous extremity movements.  No response to sternal rub or nailbed pressure.  Plantars of both need. ASSESSMENT Mr. Brent Horn is a 46 y.o. male with cardiac arrest with suspected underlying anoxic brain injury who also has a large left hemispheric infarct with complete territory involvement and left MCA with 7 mm left-to-right midline shift and cytotoxic edema.    Hospital day # 2  TREATMENT/PLAN  I have personally examined this patient, reviewed notes, independently viewed imaging studies, participated in medical decision making and plan of care.ROS completed by me personally and pertinent positives fully documented  I have made any additions or clarifications directly to the above note.  Continue hypertonic saline and increase rate to 100 cc an hour and target serum sodium goal 150-155.  Takes saline sodium every 6 hourly Continue aspirin for stroke prevention for now. Patient's prognosis is extremely poor given significant left hemispheric damage from a large infarct as well as likely additional component of global hypoxic ischemic injury from his cardiac arrest.  Family is planning to meet with palliative care and make decisions about goals of care.  Discussed with Dr. Katrinka Blazing critical care medicine. This patient is critically ill and at significant risk of neurological worsening, death and care requires constant monitoring of vital signs, hemodynamics,respiratory and cardiac monitoring, extensive review of multiple databases, frequent neurological assessment, discussion with family, other specialists and medical decision making of  high complexity.I have made any additions or clarifications directly to the above note.This critical care time does not reflect procedure time, or  teaching time or supervisory time of PA/NP/Med Resident etc but could involve care discussion time.  I spent 35 minutes of neurocritical care time  in the care of  this patient.       Delia Heady, MD Medical Director [facility] Pager: [phone] 01/16/2021 1:07 PM

## 2021-01-16 NOTE — Consult Note (Signed)
Palliative Care Consult Note                                  Date: 01/16/2021   Patient Name: Brent Horn  DOB: March 13, 1975  MRN: [id number]  Age / Sex: 46 y.o., male  PCP: Patient, No Pcp Per (Inactive) Referring Physician: Candee Furbish, MD  Reason for Consultation: Establishing goals of care  HPI/Patient Profile: 46 y.o. male  with past medical history of Arthritis, Chronic back pain, Hypertension, Kidney stone, Polycystic kidney disease, Renal disorder, cocaine abuse admitted on 01/04/2021 with out of hospital cardiac arrest preceded by hemoptysis.  When he arrived in the ER he was awake with ROSC but due to worsening respiratory status he was intubated and PCCM was consulted.  CT scan on 1/1 showed left MCA stroke with midline shift, hypertonic saline was DC'd and recommend goals of care conversation.  Neurology is on board.  PMT was consulted for goals of care conversations  Past Medical History:  Diagnosis Date   Arthritis    Chronic back pain    Hypertension    Kidney stone    Polycystic kidney disease    Renal disorder     Subjective:   This NP Walden Field reviewed medical records, received report from team, assessed the patient and then meet at the patient's bedside to discuss diagnosis, prognosis, GOC, EOL wishes disposition and options.  I met with the patient at the bedside although he is not able to meaningfully interact.  I also met with the patient's legal spouse (although estranged/separated) and his 60 year old daughter at the bedside.  I called and spoke with his son Gerald Stabs on the phone.   Concept of Palliative Care was introduced as specialized medical care for people and their families living with serious illness.  If focuses on providing relief from the symptoms and stress of a serious illness.  The goal is to improve quality of life for both the patient and the family. Values and goals of care important to  patient and family were attempted to be elicited.  Created space and opportunity for patient  and family to explore thoughts and feelings regarding current medical situation   Natural trajectory and current clinical status were discussed. Questions and concerns addressed. Patient  encouraged to call with questions or concerns.    Patient/Family Understanding of Illness: They understand he is very sick.  I further explained his current clinical situation including likely alveolar hemorrhage due to cocaine inhalation, very large stroke with worsening midline shift (per neurology now 1 cm), respiratory failure due to all of this.  I explained his high risk for brain herniation or bleed.  Life Review: The patient is legally married but separated.  He has 1 son from previous marriage, a Psychiatrist (his wife's daughter from a previous marriage), and a daughter with his wife Velna Hatchet.  He enjoys fishing, family time, going to [employer], and church.  He is Psychologist, forensic.  He enjoys country music, rock, alternative such as Training and development officer, [employer], Social research officer, government.  Patient Values: Independence.  His wife feels that he would not want to be in a long-term care facility "as a vegetable"  Goals: To be determined with further discussions tomorrow  Today's Discussion: I met with the patient's legal spouse and his 60 year old daughter.  We discussed that he is very sick, high risk for death despite aggressive medical care.  I explained in detail all the clinical  situations that are ongoing contributing to his illness.  Reflexes are diminishing were now absent, worsening brain shift, likely global hypoxia due to CPR.  I shared my impression of the CT scan of the head, the neurologist opinion per their note today, and PCCM opinion per their note today.  Overall this is felt to be unlikely survivable situation and even an instance of survival would require trach, PEG, indefinite LTAC admission.  The patient's wife shared that he has never  been a person who likes to go to the doctor.  She is feels strongly that he would not want to go to an LTAC as a vegetable and would not want prolonged ventilation.  She wants him to hurt no worse than he is, no more suffering.  After discussion she has agreed to DNR, no further escalation, plans for family meeting including the patient's spouse, son, daughter along with members of the medical team tomorrow.  I called spoke with the patient's son.  He expressed frustration that the patient's spouse has given up her decision-making and is now trying to climb it back.  I explained that, despite expressions of her inability to make his decisions, that legally she is a spouse and has the right.  He verbalized that he does understand that we are bound by legal constraints.  He states that if his opinion mattered that he would like him to be a full code for the next 24 hours until the goals of care meeting.  This would allow time for other family and friends to visit.  I encouraged them to come visit now and offered to liberalize visitation given the seriousness of his condition.  I also encouraged the son to come visit today because, in my opinion, even if he did have a cardiac arrest in the middle the night he is unlikely to survive.   After discussion we agreed for a family meeting tomorrow around 12 noon.  We will meet in the patient's room and then can proceed to the conference room.  I provided emotional and general support through therapeutic listening, reminiscing, sharing stories, and other techniques.  Answered all questions and addressed all concerns to the best of my ability.  I provided the patient's spouse with "hard decisions for loving people" book.  Review of Systems  Unable to perform ROS: Intubated   Objective:   Primary Diagnoses: Present on Admission:  Cardiac arrest [facility])   Physical Exam Vitals and nursing note reviewed.  Constitutional:      General: He is not in acute  distress.    Appearance: He is overweight. He is ill-appearing.     Interventions: He is sedated and intubated.  HENT:     Head: Normocephalic and atraumatic.  Cardiovascular:     Rate and Rhythm: Normal rate and regular rhythm.  Pulmonary:     Effort: No respiratory distress. He is intubated.  Abdominal:     General: Abdomen is protuberant.     Palpations: Abdomen is soft.  Skin:    General: Skin is warm and dry.  Neurological:     Mental Status: He is unresponsive.     Comments: Not following commands, no spontaneous movement, not withdrawing to pain stimulus    Vital Signs:  BP 111/66    Pulse 64    Temp 98.1 F (36.7 C)    Resp (!) 28    Ht 5' 11"  (1.803 m)    Wt 127.4 kg    SpO2 98%  BMI 39.17 kg/m   Palliative Assessment/Data: 10% (has NG/OG tube in place)    Advanced Care Planning:   Primary Decision Maker: NEXT OF KIN  Code Status/Advance Care Planning: DNR  A discussion was had today regarding advanced directives. Concepts specific to code status, artifical feeding and hydration, continued IV antibiotics and rehospitalization was had.  The difference between a aggressive medical intervention path and a palliative comfort care path for this patient at this time was had.   Decisions/Changes to ACP: Changed to DNR  Assessment & Plan:   Impression: Very ill 46 year old man with multiple chronic illnesses now with an exacerbation/lethal illness including huge MCA stroke, global hypoxia, diminishing her reflexes.  Likely nearing end-of-life.  Unlikely to survive this current situation.  In the event that he does survive he would be trach and PEG dependent in all LTAC indefinitely per neurology opinion.  I have discussed this current situation with the patient's family.  His wife has elected DNR and no escalation of care.  We will plan for a full family meeting tomorrow with the patient's spouse, patient's son, patient's daughter, and medical team.  Overall prognosis  is very poor/grim.  The patient's son feels that he will likely agree to DNR and possibly further comfort measures at the family meeting.  SUMMARY OF RECOMMENDATIONS   DNR No escalation of care Continue current medical treatments Liberalize visitation for family/friends to say goodbye Per RN okay for son to spend the night in room PMT will meet with the family tomorrow at 33 noon Spiritual consult for supported family meeting (especially with 57 year old daughter) PMT will continue to follow  Symptom Management:  Per primary team PMT is available to assist as needed  Prognosis:  Unable to determine  Discharge Planning:  To Be Determined   Discussed with: Medical team, nursing team, patient's family    Thank you for allowing [place] to participate in the care of Kysean Sweet PMT will continue to support holistically.  Time Total: 105 min  Greater than 50%  of this time was spent counseling and coordinating care related to the above assessment and plan.  Signed by: Walden Field, NP Palliative Medicine Team  Team Phone # [phone] (Nights/Weekends)  01/16/2021, 4:17 PM

## 2021-01-16 NOTE — Progress Notes (Signed)
Patient transported on ventilator to MRI and back to 2H22 with no complications. Vitals stable.

## 2021-01-16 NOTE — TOC CAGE-AID Note (Signed)
Transition of Care [facility]) - CAGE-AID Screening   Patient Details  Name: Brent Horn MRN: [id number] Date of Birth: [birth year]  Transition of Care [facility]) CM/SW Contact:    Macgregor Aeschliman C Tarpley-Carter, LCSWA Phone Number: 01/16/2021, 9:55 AM   Clinical Narrative: Pt is unable to participate in Cage Aid.  CSW will provide pt with resources for possible future use.  Cleota Pellerito Tarpley-Carter, MSW, LCSW-A Pronouns:  She/Her/[place] Transitions of Care Clinical Social Worker Direct Number:  [phone] [email or web address]  CAGE-AID Screening: Substance Abuse Screening unable to be completed due to: : Patient unable to participate             Substance Abuse Education Offered: Yes  Substance abuse interventions: Transport planner

## 2021-01-16 NOTE — Progress Notes (Signed)
NAME:  Brent Horn, MRN:  [id number], DOB:  1975-01-28, LOS: 2 ADMISSION DATE:  01/04/2021, CONSULTATION DATE:  12/18/2020 REFERRING MD:  Tomi Bamberger, CHIEF COMPLAINT:  hemoptysis   History of Present Illness:  OOH cardiac arrest preceded by hemoptysis Arrived to ER awake with ROSC but worsening respiratory status Intubated and sats remained in 60s.  PCCM consulted for admission.  Pertinent  Medical History  HTN PCKD  Significant Hospital Events: Including procedures, antibiotic start and stop dates in addition to other pertinent events   12/31 admission 1/1 CT showing L MCA stroke with midline shift, hypertonic saline Dc'd, GOC discussion  Interim History / Subjective:  No events. Sluggish UOP. Derecruits with sedation weans.  Objective   Blood pressure 115/68, pulse 71, temperature 97.7 F (36.5 C), resp. rate (!) 28, height _0  (1.803 m), weight 127.4 kg, SpO2 100 %. CVP:  [9 mmHg-11 mmHg] 9 mmHg  Vent Mode: PRVC FiO2 (%):  [50 %-100 %] 70 % Set Rate:  [28 bmp] 28 bmp Vt Set:  [600 mL] 600 mL PEEP:  [8 IRW43-15 cmH20] 14 cmH20 Plateau Pressure:  [22 cmH20-35 cmH20] 30 cmH20   Intake/Output Summary (Last 24 hours) at 01/16/2021 0700 Last data filed at 01/16/2021 0400 Gross per 24 hour  Intake 3285.99 ml  Output 665 ml  Net 2620.99 ml    Filed Weights   01/12/2021 1630 01/15/21 0445  Weight: 129.6 kg 127.4 kg    Examination: Heavily sedated Bloody secretions from ETT Lungs diminished bases, clear anteriorly Developing anasarca Yesterday with sedation wean: purposeful movement on L without following commands, dense R hemiplegia, brainstem reflexes intact (with sedation wean derecruited and required PEEP 8>>22 overnight, now back to 14)  Cr improved Sodium going up slowly with hypertonic saline Fever curve improved  MRI bilateral cerebral infarcts, dense L MCA, scattered right cerebral  Resolved Hospital Problem list   N/a  Assessment & Plan:  OOH prolonged  cardiac arrest presumed due to the aspiration event with ROSC- some report that he was groggy and able to answer questions per EDP but quickly decompensated. ARDS secondary to massive hemoptysis in setting of cocaine abuse, stomach was clean with NGT on admission so nasal source unlikely, suspect this was acute alveolitis from cocaine inhalation; no evidence of ongoing alveolar bleeding on serial lavage 01/15/2021 Bilateral CVAs- complete L MCA, scattered R cerebral infarcts, cocaine effect vs. Embolic phenomenon vs. Effects from prolonged severe hypoxemia and shock Cytotoxic edema/L>R midline shift- from L MCA stroke, on 3% saline as salvage Cocaine positive: probably led to above, ?epistaxis vs. alveolar hemorrhage Shock- related to aspiration event and sedation, improved Stress cardiomyopathy- noted on echo Heavy smoker, heavy drinker Accidental carotid cannulation- no injury/dissection on imaging (MRA, carotid duplex), do not think contributed to CVAs above given their bilateral nature and other factors (cocaine, hypoxemia, severe shock) Acute kidney injury, hx PCKD- in setting of above, improved but oliguric Class 3 obesity  - Continue CTX x 5 days, f/u BAL culture data - Hypertonic saline to Na 150-155 - Keep heavily sedated as he de-recruits easily and gags on tube increasing ICP - Start lasix - Thiamine/folate - Continue GOC conversations; son is still processing how devastating above combination of issues are; he is comfortable for patient to be alive at any cost including minimal ability to do ADLs.  He states "I understand this is selfish of me but I do not want to bury my dad."  I will ask palliative to help continue these discussions.  I do not think there will be a meaningful recovery here  Best Practice (right click and "Reselect all SmartList Selections" daily)   Diet/type: TF DVT prophylaxis: hold for now given size of stroke GI prophylaxis: PPI Lines: yes and it is still  needed Foley:  Yes, and it is still needed Code Status:  full code Last date of multidisciplinary goals of care discussion [01/15/21]  83 minutes crit care time independent of procedures  Erskine Emery MD PCCM

## 2021-01-16 NOTE — Progress Notes (Signed)
Initial Nutrition Assessment  DOCUMENTATION CODES:   Obesity unspecified  INTERVENTION:   Tube Feeding via Cortrak:   Vital High Protein at 40 ml/hr Pro-Source TF 90 mL QID Goal regimen provides 173 g of protein, 1280 kcals and 730 mL of free water  TF regimen and propofol at current rate providing 2101  total kcal/day  Continue Thiamine and Folic Acid supplementation  NUTRITION DIAGNOSIS:   Inadequate oral intake related to acute illness as evidenced by NPO status.  GOAL:   Patient will meet greater than or equal to 90% of their needs  MONITOR:   Vent status, Labs, Weight trends, TF tolerance  REASON FOR ASSESSMENT:   Consult, Ventilator Enteral/tube feeding initiation and management  ASSESSMENT:   46 yo male admitted post prolonged OOH cardiac arrest due to aspiration event, ARDS, bilateral CVAs with midline shift. +cocaine on admission with hx of heavy tobacco and EtOH abuse. PMH includes HTN, CKD  12/31 Admitted 01/01 CT head with L MCA stroke with midline shift  Pt remains on vent support, GOC discussions ongoing. Remains Full Code at present Propofol: 31.1 ml/hr (821 kcals/day)  Cortrak placed today-gastric position  Unable to obtain diet and weight history from patient at this time.   No recent weights per weigh encounters; current wt 127.4 kg   Labs: Creatinine 1.45, BUN 22 Meds: lasix, ss novolog, thiamine, folic acid, colace, miralax prn   Diet Order:   Diet Order             Diet NPO time specified  Diet effective now                   EDUCATION NEEDS:   Not appropriate for education at this time  Skin:  Skin Assessment: Reviewed RN Assessment  Last BM:  no documented BM  Height:   Ht Readings from Last 1 Encounters:  Feb 11, 2021 5\' 11"  (1.803 m)    Weight:   Wt Readings from Last 1 Encounters:  01/15/21 127.4 kg     BMI:  Body mass index is 39.17 kg/m.  Estimated Nutritional Needs:   Kcal:  1750-1950  kcals  Protein:  155-195 g  Fluid:  >/= 2 L    03/15/21 MS, RDN, LDN, CNSC Registered Dietitian III Clinical Nutrition RD Pager and On-Call Pager Number Located in [place]

## 2021-01-17 ENCOUNTER — Inpatient Hospital Stay (HOSPITAL_COMMUNITY): Payer: Medicaid Other

## 2021-01-17 ENCOUNTER — Encounter (HOSPITAL_COMMUNITY): Payer: Self-pay | Admitting: Internal Medicine

## 2021-01-17 ENCOUNTER — Other Ambulatory Visit: Payer: Self-pay

## 2021-01-17 DIAGNOSIS — Z529 Donor of unspecified organ or tissue: Secondary | ICD-10-CM | POA: Diagnosis not present

## 2021-01-17 LAB — POCT I-STAT 7, (LYTES, BLD GAS, ICA,H+H)
Acid-base deficit: 3 mmol/L — ABNORMAL HIGH (ref 0.0–2.0)
Bicarbonate: 22.5 mmol/L (ref 20.0–28.0)
Calcium, Ion: 1.24 mmol/L (ref 1.15–1.40)
HCT: 27 % — ABNORMAL LOW (ref 39.0–52.0)
Hemoglobin: 9.2 g/dL — ABNORMAL LOW (ref 13.0–17.0)
O2 Saturation: 100 %
Patient temperature: 36.7
Potassium: 3.9 mmol/L (ref 3.5–5.1)
Sodium: 158 mmol/L — ABNORMAL HIGH (ref 135–145)
TCO2: 24 mmol/L (ref 22–32)
pCO2 arterial: 41.5 mmHg (ref 32.0–48.0)
pH, Arterial: 7.341 — ABNORMAL LOW (ref 7.350–7.450)
pO2, Arterial: 313 mmHg — ABNORMAL HIGH (ref 83.0–108.0)

## 2021-01-17 LAB — BASIC METABOLIC PANEL
Anion gap: 7 (ref 5–15)
BUN: 37 mg/dL — ABNORMAL HIGH (ref 6–20)
CO2: 22 mmol/L (ref 22–32)
Calcium: 7.7 mg/dL — ABNORMAL LOW (ref 8.9–10.3)
Chloride: 122 mmol/L — ABNORMAL HIGH (ref 98–111)
Creatinine, Ser: 2.02 mg/dL — ABNORMAL HIGH (ref 0.61–1.24)
Glucose, Bld: 126 mg/dL — ABNORMAL HIGH (ref 70–99)
Potassium: 3.8 mmol/L (ref 3.5–5.1)
Sodium: 151 mmol/L — ABNORMAL HIGH (ref 135–145)

## 2021-01-17 LAB — APTT: aPTT: 31 seconds (ref 24–36)

## 2021-01-17 LAB — COMPREHENSIVE METABOLIC PANEL
ALT: 15 U/L (ref 0–44)
AST: 16 U/L (ref 15–41)
Albumin: 1.9 g/dL — ABNORMAL LOW (ref 3.5–5.0)
Alkaline Phosphatase: 42 U/L (ref 38–126)
Anion gap: 7 (ref 5–15)
BUN: 47 mg/dL — ABNORMAL HIGH (ref 6–20)
CO2: 22 mmol/L (ref 22–32)
Calcium: 8 mg/dL — ABNORMAL LOW (ref 8.9–10.3)
Chloride: 127 mmol/L — ABNORMAL HIGH (ref 98–111)
Creatinine, Ser: 2.14 mg/dL — ABNORMAL HIGH (ref 0.61–1.24)
GFR, Estimated: 37 mL/min — ABNORMAL LOW (ref >60)
Glucose, Bld: 109 mg/dL — ABNORMAL HIGH (ref 70–99)
Potassium: 3.8 mmol/L (ref 3.5–5.1)
Sodium: 156 mmol/L — ABNORMAL HIGH (ref 135–145)
Total Bilirubin: 0.7 mg/dL (ref 0.3–1.2)
Total Protein: 5.1 g/dL — ABNORMAL LOW (ref 6.5–8.1)

## 2021-01-17 LAB — CBC
HCT: 32.5 % — ABNORMAL LOW (ref 39.0–52.0)
Hemoglobin: 10.9 g/dL — ABNORMAL LOW (ref 13.0–17.0)
MCH: 30.7 pg (ref 26.0–34.0)
MCHC: 33.5 g/dL (ref 30.0–36.0)
MCV: 91.5 fL (ref 80.0–100.0)
Platelets: 126 10*3/uL — ABNORMAL LOW (ref 150–400)
RBC: 3.55 MIL/uL — ABNORMAL LOW (ref 4.22–5.81)
RDW: 14.2 % (ref 11.5–15.5)
WBC: 13.5 10*3/uL — ABNORMAL HIGH (ref 4.0–10.5)
nRBC: 0 % (ref 0.0–0.2)

## 2021-01-17 LAB — SODIUM
Sodium: 152 mmol/L — ABNORMAL HIGH (ref 135–145)
Sodium: 152 mmol/L — ABNORMAL HIGH (ref 135–145)
Sodium: 154 mmol/L — ABNORMAL HIGH (ref 135–145)
Sodium: 156 mmol/L — ABNORMAL HIGH (ref 135–145)

## 2021-01-17 LAB — GLUCOSE, CAPILLARY
Glucose-Capillary: 126 mg/dL — ABNORMAL HIGH (ref 70–99)
Glucose-Capillary: 109 mg/dL — ABNORMAL HIGH (ref 70–99)
Glucose-Capillary: 122 mg/dL — ABNORMAL HIGH (ref 70–99)
Glucose-Capillary: 101 mg/dL — ABNORMAL HIGH (ref 70–99)
Glucose-Capillary: 102 mg/dL — ABNORMAL HIGH (ref 70–99)
Glucose-Capillary: 136 mg/dL — ABNORMAL HIGH (ref 70–99)

## 2021-01-17 LAB — SODIUM, URINE, RANDOM: Sodium, Ur: 71 mmol/L

## 2021-01-17 LAB — FIBRINOGEN: Fibrinogen: 798 mg/dL — ABNORMAL HIGH (ref 210–475)

## 2021-01-17 LAB — PHOSPHORUS: Phosphorus: 2.9 mg/dL (ref 2.5–4.6)

## 2021-01-17 LAB — PROTIME-INR
INR: 1.2 (ref 0.8–1.2)
Prothrombin Time: 15 seconds (ref 11.4–15.2)

## 2021-01-17 LAB — MAGNESIUM: Magnesium: 2.5 mg/dL — ABNORMAL HIGH (ref 1.7–2.4)

## 2021-01-17 IMAGING — DX DG CHEST 1V PORT
1 series · 1 of 1 positions shown · non-contrast
Comparison: Chest x-ray [DATE].

CLINICAL DATA: Cardiac arrest.

EXAM:
PORTABLE CHEST 1 VIEW

[chest ap]
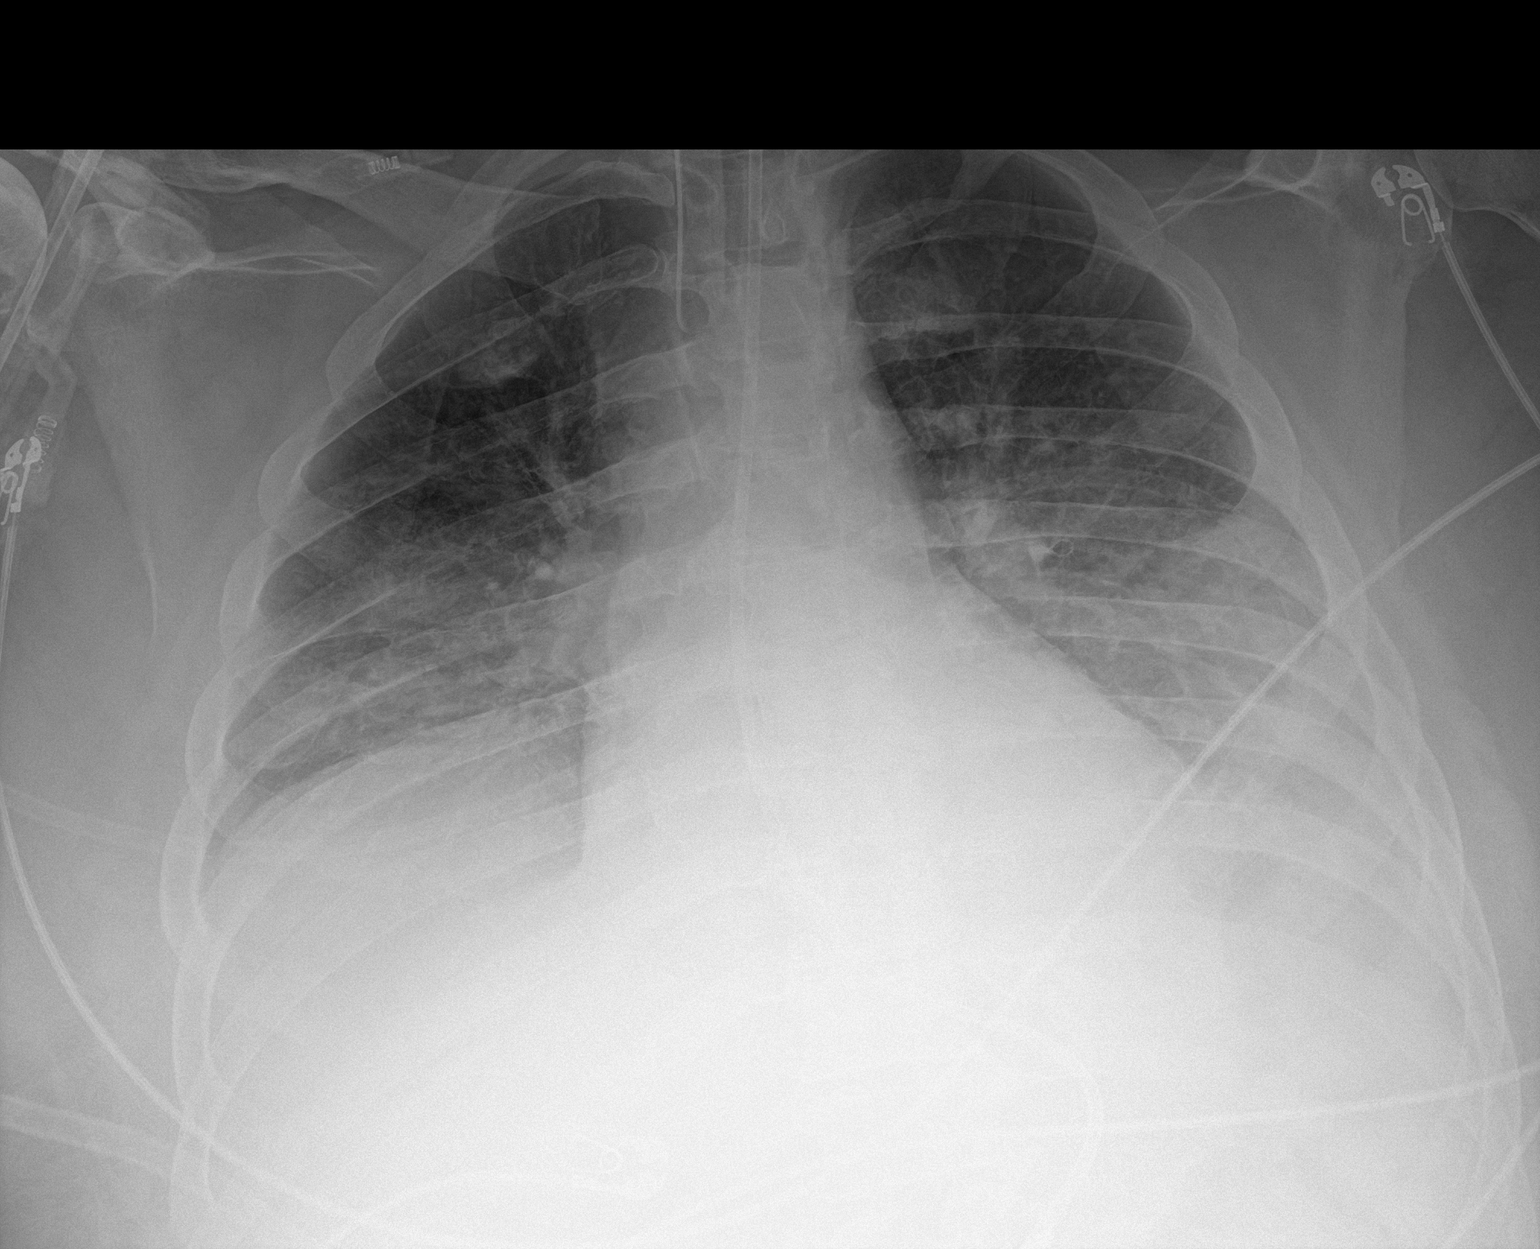

[1 of 1 positions shown; findings below may reference images not displayed]

FINDINGS: Endotracheal tube tip is 4.7 cm above the carina. Enteric tube
extends below the diaphragm.

The cardiomediastinal silhouette is within normal limits. There is a
small left pleural effusion which is increased. There are bibasilar
patchy opacities which have increased. There is no pneumothorax or
acute fracture.
IMPRESSION: 1. Increasing small left pleural effusion.
2. Increasing bibasilar infiltrates.

## 2021-01-17 MED ORDER — FUROSEMIDE 10 MG/ML IJ SOLN
40.0000 mg | Freq: Once | INTRAMUSCULAR | Status: AC
Start: 2021-01-17 — End: 2021-01-17
  Administered 2021-01-17: 40 mg via INTRAVENOUS
  Filled 2021-01-17: qty 4

## 2021-01-17 MED ORDER — ALBUTEROL SULFATE (2.5 MG/3ML) 0.083% IN NEBU: 2.5000 mg | INHALATION_SOLUTION | RESPIRATORY_TRACT | Status: DC | PRN

## 2021-01-17 MED ORDER — POTASSIUM CHLORIDE 20 MEQ PO PACK
40.0000 meq | PACK | Freq: Once | ORAL | Status: AC
  Administered 2021-01-17: 40 meq via ORAL
  Filled 2021-01-17: qty 2

## 2021-01-17 NOTE — Progress Notes (Signed)
O2 recruitment done on 20 PEEP for 30 seconds per donor services.

## 2021-01-17 NOTE — Progress Notes (Signed)
Daily Progress Note   Patient Name: Brent Horn       Date: 01/17/2021 DOB: 07/23/1975  Age: 46 y.o. MRN#: [id number] Attending Physician: Candee Furbish, MD Primary Care Physician: Patient, No Pcp Per (Inactive) Admit Date: 01/08/2021 Length of Stay: 3 days  Reason for Consultation/Follow-up: Establishing goals of care  HPI/Patient Profile:  46 y.o. male  with past medical history of Arthritis, Chronic back pain, Hypertension, Kidney stone, Polycystic kidney disease, Renal disorder, cocaine abuse admitted on 01/12/2021 with out of hospital cardiac arrest preceded by hemoptysis.  When he arrived in the ER he was awake with ROSC but due to worsening respiratory status he was intubated and PCCM was consulted.   CT scan on 1/1 showed left MCA stroke with midline shift, hypertonic saline was DC'd and recommend goals of care conversation.  Neurology is on board.   PMT was consulted for goals of care conversations  Subjective:   Subjective: Chart Reviewed. Updates received. Patient Assessed. Created space and opportunity for patient  and family to explore thoughts and feelings regarding current medical situation.  Today's Discussion: Today I met with multiple members of the patient's family including the patient's son Brent Horn, daughter-in-law Brent Horn, wife Brent Horn, daughter Brent Horn, friend Larene Beach, de facto "mom" Brent Horn, and the patient's brother.  I was joined by the patient's nurse Benjamine Mola and Dr. Leonie Man from neurology/stroke team.  After allowing time for visitation with the patient we proceeded to a conference room for extensive goals of care discussion.  Dr. Leonie Man explained the patient's significant neurological damage and that between anoxic brain injury from CPR x30 minutes and large carotid/MCA stroke with progressive cerebral edema despite hypertonic saline solution that the patient is unlikely to survive his current condition.  In the miraculous situation where he did survive he would  be noninteractive, require indefinite life support through trach/vent and PEG tube for feeding nutrition, 24-hour nursing care.  The family all agreed that the patient would not want to live like that.  We discussed options including continued aggressive care despite the highly likely untreatable situation versus a shift to comfort focused care.  It was the recommendation of the medical team and palliative team to shift care to focus on comfort.  I explained that the patient would undergo compassionate extubation at some point and medications will be provided for pain, dyspnea, nausea, vomiting, agitation, anxiety, and essentially any symptoms that could,.  We would allow the patient to pass away peacefully with a focus on dignity, comfort.  They expressed understanding.  After the family discussed amongst themselves they decided they would like to proceed with this option.  However, they would like a little time for other family/friends to come visit him.    The plan was to continue current treatments, no escalation, continue DNR.  They understand that if he codes he would be allowed to pass peacefully.  We tentatively plan for compassionate extubation on Thursday around 12 noon after family has had time to visit.  If he herniates and codes, has significant clinical decline, he would pass naturally.  If he has no respiratory drive and a compassionate extubation he would be provided comfort and allowed to pass naturally.  Review of Systems  Unable to perform ROS: Intubated   Objective:   Vital Signs:  BP 100/73    Pulse (!) 59    Temp (!) 97.5 F (36.4 C)    Resp (!) 28    Ht $R'5\' 11"'XL$  (1.803 m)    Wt 135.9  kg    SpO2 97%    BMI 41.79 kg/m   Physical Exam: Physical Exam Vitals and nursing note reviewed.  Constitutional:      Appearance: He is obese. He is ill-appearing.     Interventions: He is sedated, chemically paralyzed and intubated.  HENT:     Head: Normocephalic and atraumatic.   Cardiovascular:     Rate and Rhythm: Normal rate and regular rhythm.  Pulmonary:     Effort: No respiratory distress. He is intubated.     Breath sounds: Rhonchi present.  Abdominal:     General: Abdomen is protuberant.     Palpations: Abdomen is soft.  Skin:    General: Skin is warm and dry.    Palliative Assessment/Data: 10%   Assessment & Plan:   Impression: Present on Admission:  Cardiac arrest [facility])  Very ill 46 year old man with multiple chronic illnesses now with an exacerbation/lethal illness including huge MCA stroke, global hypoxia, diminishing her reflexes.  Likely nearing end-of-life.  Unlikely to survive this current situation.  In the event that he does survive he would be trach and PEG dependent in all LTAC indefinitely per neurology opinion.  I have discussed this current situation with the patient's family.  His wife has elected DNR and no escalation of care.  We will plan for a full family meeting tomorrow with the patient's spouse, patient's son, patient's daughter, and medical team.  Overall prognosis is very poor/grim.  The patient's son feels that he will likely agree to DNR and possibly further comfort measures at the family meeting.  Goals of care meeting was had today and the family has agreed to continue DNR, no escalation of care, tentatively plan for compassionate extubation on Thursday, 01-23-21 at around noon.  SUMMARY OF RECOMMENDATIONS   Continue current treatments Remain DNR No escalation of care If the patient codes, has significant clinical decline including but not limited to brain herniation and he would be allowed to pass naturally given DNR status Allow family time to visit Liberalize visitation Tentatively plan for compassionate extubation 01-23-2021 around 12 noon PMT will continue to follow  Code Status: DNR  Prognosis: Hours - Days  Discharge Planning: Anticipated Hospital Death  Discussed with: Medical team, nursing team, neurology,  patient's family  Thank you for allowing [place] to participate in the care of Brent Horn PMT will continue to support holistically.  Time Total: 90 min  Visit consisted of counseling and education dealing with the complex and emotionally intense issues of symptom management and palliative care in the setting of serious and potentially life-threatening illness. Greater than 50%  of this time was spent counseling and coordinating care related to the above assessment and plan.  Walden Field, NP Palliative Medicine Team  Team Phone # [phone] (Nights/Weekends)  09/13/2020, 8:17 AM

## 2021-01-17 NOTE — Progress Notes (Signed)
NAME:  Brent Horn, MRN:  [id number], DOB:  26-Sep-1975, LOS: 3 ADMISSION DATE:  12/31/2020, CONSULTATION DATE:  12/31/2020 REFERRING MD:  Tomi Bamberger, CHIEF COMPLAINT:  hemoptysis   History of Present Illness:  46 y.o. male  with past medical history of Arthritis, Chronic back pain, Hypertension, Kidney stone, Polycystic kidney disease, Renal disorder, cocaine abuse admitted on 12/19/2020 with out of hospital cardiac arrest preceded by hemoptysis.  When he arrived in the ER he was awake with ROSC but due to worsening respiratory status he was intubated and PCCM was consulted.  Pertinent  Medical History  HTN PCKD  Significant Hospital Events: Including procedures, antibiotic start and stop dates in addition to other pertinent events   12/31 admission 1/1 CT showing L MCA stroke with midline shift, hypertonic saline Dc'd, GOC discussion and Palliative Care consulted Bronch 01/15/2021 for large amount of bloody secretions 01/16/2021 Made DNR with no escalation of care after family meeting, plan if for additional family meeting 01/17/2021 at 12 noon for further discussion   Interim History / Subjective:  Made DNR 01/16/21, no escalation of care Na 152/ K 3.8/Cl 122/ Creatinine 2.02/ , Mag 2.5.  WBC 13.5/ HGB 10.9/Platelets 126 Echo 01/15/2021 Net + 12 L  Abnormal septal motion mild global  hypokinesis , EF 45-50%,  Hemoptysis improving, Urine Output improving Nimbex for vent asynchrony, nursing working on wheezing  Objective   Blood pressure 107/68, pulse 67, temperature (!) 97.3 F (36.3 C), resp. rate (!) 28, height _0  (1.803 m), weight 135.9 kg, SpO2 96 %.    Vent Mode: PRVC FiO2 (%):  [50 %-60 %] 50 % Set Rate:  [28 bmp] 28 bmp Vt Set:  [500 mL-600 mL] 500 mL PEEP:  [12 cmH20] 12 cmH20 Plateau Pressure:  [26 cmH20-32 cmH20] 30 cmH20   Intake/Output Summary (Last 24 hours) at 01/17/2021 0916 Last data filed at 01/17/2021 0800 Gross per 24 hour  Intake 4509.07 ml  Output 2995 ml  Net  1514.07 ml   Filed Weights   12/28/2020 1630 01/15/21 0445 01/17/21 0600  Weight: 129.6 kg 127.4 kg 135.9 kg    Examination: General Heavily sedated and paralyzed  male , on mechanical ventilation  Lungs: Fewer bloody secretions per ETT, Bilateral chest excursion, Rhonchi and wheezing noted, bilaterally diminished per bases.  Cardiac: Developing anasarca Neuro 01/15/2021 with sedation wean: purposeful movement on L without following commands, dense R hemiplegia, brainstem reflexes intact (with sedation wean derecruited and required PEEP 8>>22 overnight, back to 14 on 1 /2, and now 12 on 1/3), Does not follow commands Extremities: No obvious deformities, warm to touch with brisk refill  Creatinine remains elevated Sodium at goal of 152 T max 99, with WBC of 13.5  MRI 01/16/2021 bilateral cerebral infarcts, dense L MCA territory infarct, Cytotoxic edema causes 7 mm of midline shift.  ( Noted as a 5 mm shift on CT, has been on hypertonic saline)  Small acute cortical infarcts at the right cerebral convexity.  Resolved Hospital Problem list   N/a  Assessment & Plan:  OOH prolonged cardiac arrest presumed due to the aspiration event with ROSC-  some report that he was groggy and able to answer questions per EDP but quickly decompensated. Plan  Continue Support Wean pressors for MAP > 65 mm Hg Telemetry Normothermia Maintain Mag > 2  and K > 4  ARDS secondary to massive hemoptysis in setting of cocaine abuse, stomach was clean with NGT on admission so nasal source unlikely, suspect this  was acute alveolitis from cocaine inhalation; no evidence of ongoing alveolar bleeding on serial lavage 01/15/2021 Plan Wean FiO2 and PEEP as able for sats > 92% Will add albuterol nebs for bronchospasm Continue Rocephin as ordered Full vent support while on Nimbex for vent synchrony Requiring Nimbex Daily SBT/ WUA if he ever qualifies PAD protocol >> Fentanyl and Propofol Keep heavily sedated as he  de-recruits easily and gags on tube increasing ICP Continue CTX x 5 days, f/u BAL culture data>> Few Staph Aureus , repeating susceptability  Bilateral CVAs- complete L MCA, scattered R cerebral infarcts, cocaine effect vs. Embolic phenomenon vs. Effects from prolonged severe hypoxemia and shock Cytotoxic edema/L>R midline shift-  from L MCA stroke, on 3% saline as salvage Plan  Continue hypertonic  saline>> Na 152 at goal of 150-155 Check serum Na Q 6 Appreciate Neuro assist MRI and CT Head results noted   Frequent Neuro Checks  ASA for stroke prevention  Palliative Care consult for Goals of care  Cocaine positive: probably led to above, ?epistaxis vs. alveolar hemorrhage Bronch 01/15/2021 Hemoptysis improving 01/17/2021 Plan Trend CBC Monitor for worsening  Paralytic to prevent vent dyssynchrony   Shock- related to aspiration event and sedation, improved Off pressors Plan MAP Goal > 65 mm Hg  Continue supportive care  Stress cardiomyopathy- noted on echo Plan Supportive care   Heavy smoker, heavy drinker Plan Thiamine and Folate  Fentanyl and Propofol   Acute kidney injury, hx PCKD- in setting of above, improved  Total of 3500 cc UP last 24 hours Foley cath change with some improvement in UO Plan Trend BMET Daily  Strict I&O Replete lytes as needed  Maintain renal perfusion MAP goal of 65 mm Hg  - Continue GOC conversations; son is still processing how devastating above combination of issues are; he is comfortable for patient to be alive at any cost including minimal ability to do ADLs.  He states "I understand this is selfish of me but I do not want to bury my dad."  Palliation met with family 01/16/2021 and they made patient an DNR, with no escalation of care . For family meeting at 6 noon 01/17/2021.  Best Practice (right click and "Reselect all SmartList Selections" daily)   Diet/type: TF DVT prophylaxis: hold for now given size of stroke GI prophylaxis:  PPI Lines: yes and it is still needed Foley:  Yes, and it is still needed Code Status:  full code Last date of multidisciplinary goals of care discussion [01/15/21]  45 minutes Critical Care Time   Magdalen Spatz, MSN, AGACNP-BC [place] for personal pager PCCM on call pager [phone]  01/17/2021 11:01 AM

## 2021-01-17 NOTE — Progress Notes (Signed)
[facility] Progress Note Patient Name: Brent Horn DOB: Sep 26, 1975 MRN: [id number]   Date of Service  01/17/2021  HPI/Events of Note  Notified of Na 152 from 147 On 3% saline at 100 cc/hr Pharm D recommends decreasing to 50 ml /hr  eICU Interventions  Ordered to decreased hypertonic saline to 50 ml/hr and continue to monitor sodium level closely Also added BMP to AM labs     Intervention Category Intermediate Interventions: Electrolyte abnormality - evaluation and management  Shona Needles Alma Muegge 01/17/2021, 4:02 AM

## 2021-01-17 NOTE — Procedures (Signed)
Bronchoscopy Procedure Note  Brent Horn  [id number]  1975/09/09  Date:01/17/21  Time:7:11 PM   Provider Performing:Jacobe Study M [name]   Procedure(s):  Flexible Bronchoscopy [phone]) and Flexible bronchoscopy with bronchial alveolar lavage ([id number])  Indication(s) Donor evaluation Bilateral CVA with complete L MCA and scattered R cerebral infarcts complicated by cytotoxc edema with midline shift ARDS due to massive hemoptysis  Consent Risks of the procedure as well as the alternatives and risks of each were explained to the patient and/or caregiver.  Consent for the procedure was obtained and is signed in the bedside chart  Anesthesia None   Time Out Verified patient identification, verified procedure, site/side was marked, verified correct patient position, special equipment/implants available, medications/allergies/relevant history reviewed, required imaging and test results available.   Sterile Technique Usual hand hygiene, masks, gowns, and gloves were used   Procedure Description Bronchoscope advanced through endotracheal tube and into airway.  Airways were examined down to subsegmental level with findings noted below.   Following diagnostic evaluation, BAL(s) performed in RUL anterior segment with normal saline and return of 15cc cloudy fluid  Findings:  - pink mucopurulent secretions in nearly all dependent lung segments, lavaged and suctioned   Complications/Tolerance None; patient tolerated the procedure well. Chest X-ray is not needed post procedure.   EBL Minimal   Specimen(s) BAL sent for culture, viral panel

## 2021-01-17 NOTE — Progress Notes (Signed)
eLink Physician-Brief Progress Note Patient Name: Brent Horn DOB: 12-28-1975 MRN: [id number]   Date of Service  01/17/2021  HPI/Events of Note  Serum sodium 156, [name] asking for Direct Bilirubin to be ordered x 2.  eICU Interventions  Hypertonic saline gtt infusion rate reduced to 20 ml / hour, Direct Bilirubin orders entered.        [name] U Darinda [place] 01/17/2021, 10:00 PM

## 2021-01-17 NOTE — Progress Notes (Signed)
This chaplain responded to PMT consult for spiritual care presence in the [facility] Meeting. The chaplain was updated by the PMT NP-Eric and the Pt. RN-Elizabeth before the meeting.  The chaplain began rapport building with the Pt. family at the bedside and in the consult area. The chaplain observes the Pt. maternal representative-[name], as the person blending the family presence and emotions.  The chaplain listened reflectively as the family accepted the chaplain's invitation for storytelling in the grief process.   The chaplain educated the family on the role of a hospital chaplain and offered a individual spiritual care  presence as needed.  Brent Horn requested prayer after the meeting ended.  Chaplain Stephanie Acre [phone]

## 2021-01-17 NOTE — Progress Notes (Signed)
This chaplain attempted F/U spiritual care with the Pt. family. Family not at the bedside.  Chaplain Stephanie Acre [phone]

## 2021-01-17 NOTE — Progress Notes (Signed)
0700 - Report received from PM RN.  Safety checks performed.  All lines and drips verified. Hand hygiene performed before/after each pt contact. 0800 - Assessment & Rx.  Son present at bedside. [place] called.  All questions answered. 0930 Judson Roch, NP rounded. [place], Set designer rounded.  He advised he would wait until after family meeting to discuss organ donation with the family. 1148 - 3% pump noted to be off.  Confirmed w Judson Roch, NP that the order hadn't been changed.  3% restarted. 1200 Randall Hiss, Palliative NP rounded with Harrie Foreman.  Family brought into conference room in the waiting room to discuss [place].  Dr. Leonie Man, Myer Haff, and myself were present.  Multiple family members present.  Pt's overall poor prognosis was discussed.  Family (including Idamae Lusher, and Summer) agreed that pt would not want to live where he would be completely dependent upon others for basic bodily functions.  Family decided that they wanted to give various family members and friends time to come say their goodbyes.  Tentatively, they advised that they would like to transition pt to comfort care/compassionately extubate Thursday (01-29-21) at around noon. [place] from [employer], pt's son to come back to bedside to talk about organ donation. 1600 - Requests placed by Ronalee Belts from [employer]  Orders confirmed with CCM staff. 1800 - Dr. [name] performed time out.  Safety checks performed.  Appropriate PPE worn by all staff.  BAL sample sent to lab. 1830 - New TF bottle, setup, and Lopez valve. 1900 - Report given to PM RN.  All questions answered.  Various family members and friends in and out of pt's room throughout the day.

## 2021-01-17 NOTE — Progress Notes (Signed)
Stroke Team Progress Note  SUBJECTIVE Patient remains sedated with neuromuscular blockade due to ventilator dyssynchrony and respiratory failure..  Neurological exam exam is limited and unchanged.  I had a long discussion with his wife, daughter and multiple other family members about his prognosis and goals of care along with palliative care team nurse practitioner.  Family agrees to DNR and no escalation of care likely compassionate extubation in a few days when family is ready.   OBJECTIVE Most recent Vital Signs: Temp: 97.5 F (36.4 C) (01/03 1400) Temp Source: Bladder (01/03 1200) BP: 100/68 (01/03 1400) Pulse Rate: 59 (01/03 1200) Respiratory Rate: (!) 28 O2 Saturdation: 97%  CBG (last 3)  Recent Labs    01/17/21 0359 01/17/21 0807 01/17/21 1136  GLUCAP 126* 109* 122*       Studies:  CT Head large left MCA infarct also involving occipital lobe with 5 mm left-to-right shift. MRI Brain complete left MCA territory infarct including left occipital lobe with cytotoxic edema with 7 mm left-to-right midline shift.  Additionally small acute cortical infarcts in the right cerebral convexity. MRA*s occluded left middle cerebral artery.  MRI of the neck unremarkable ECHO : Diminished left ventricular ejection fraction 45 to 50% with mild global hypokinesia.  No definite clot. LDL pending HbA1c 5.8 Urine drug screen positive for cocaine and marijuana  Physical Exam:   Middle-aged male who is intubated sedated and paralyzed  . Afebrile. Head is nontraumatic. Neck is supple without bruit.    Cardiac exam no murmur or gallop. Lungs are clear to auscultation. Distal pulses are well felt.  Neurological Exam :  Patient is sedated intubated with neuromuscular blockade.  Eyes are closed.  Pupils are 2 mm very sluggishly reactive.  Corneal reflexes are absent bilaterally.  Also movements are absent.  Weak cough and gag.  No spontaneous extremity movements.  No response to sternal rub or nailbed  pressure.  Plantars of both need. ASSESSMENT Mr. Azayvion Hanby is a 46 y.o. male with cardiac arrest with suspected underlying anoxic brain injury who also has a large left hemispheric infarct with complete territory involvement and left MCA with 7 mm left-to-right midline shift and cytotoxic edema.    Hospital day # 3  TREATMENT/PLAN    Patient's prognosis is extremely poor given significant left hemispheric damage from a large infarct as well as likely additional component of global hypoxic ischemic injury from his cardiac arrest.  I participated in long meeting with patient's wife, daughter, brother and multiple family members and discuss his poor prognosis and family agrees to DNR and no escalation of care and likely compassionate extubation in the next few days..  Discussed with Dr. Walden Field, NP palliative care medicine team. This patient is critically ill and at significant risk of neurological worsening, death and care requires constant monitoring of vital signs, hemodynamics,respiratory and cardiac monitoring, extensive review of multiple databases, frequent neurological assessment, discussion with family, other specialists and medical decision making of high complexity.I have made any additions or clarifications directly to the above note.This critical care time does not reflect procedure time, or teaching time or supervisory time of PA/NP/Med Resident etc but could involve care discussion time.  I spent 32 minutes of neurocritical care time  in the care of  this patient.       Antony Contras, MD Medical Director [facility] Pager: [phone] 01/17/2021 2:48 PM

## 2021-01-18 ENCOUNTER — Inpatient Hospital Stay (HOSPITAL_COMMUNITY): Payer: Medicaid Other

## 2021-01-18 DIAGNOSIS — G931 Anoxic brain damage, not elsewhere classified: Secondary | ICD-10-CM

## 2021-01-18 LAB — POCT I-STAT 7, (LYTES, BLD GAS, ICA,H+H)
Acid-Base Excess: 0 mmol/L (ref 0.0–2.0)
Acid-base deficit: 3 mmol/L — ABNORMAL HIGH (ref 0.0–2.0)
Acid-base deficit: 5 mmol/L — ABNORMAL HIGH (ref 0.0–2.0)
Acid-base deficit: 2 mmol/L (ref 0.0–2.0)
Bicarbonate: 22 mmol/L (ref 20.0–28.0)
Bicarbonate: 19.9 mmol/L — ABNORMAL LOW (ref 20.0–28.0)
Bicarbonate: 24.7 mmol/L (ref 20.0–28.0)
Bicarbonate: 22.7 mmol/L (ref 20.0–28.0)
Calcium, Ion: 1.24 mmol/L (ref 1.15–1.40)
Calcium, Ion: 1.25 mmol/L (ref 1.15–1.40)
Calcium, Ion: 1.25 mmol/L (ref 1.15–1.40)
Calcium, Ion: 1.27 mmol/L (ref 1.15–1.40)
HCT: 26 % — ABNORMAL LOW (ref 39.0–52.0)
HCT: 29 % — ABNORMAL LOW (ref 39.0–52.0)
HCT: 30 % — ABNORMAL LOW (ref 39.0–52.0)
HCT: 29 % — ABNORMAL LOW (ref 39.0–52.0)
Hemoglobin: 8.8 g/dL — ABNORMAL LOW (ref 13.0–17.0)
Hemoglobin: 9.9 g/dL — ABNORMAL LOW (ref 13.0–17.0)
Hemoglobin: 10.2 g/dL — ABNORMAL LOW (ref 13.0–17.0)
Hemoglobin: 9.9 g/dL — ABNORMAL LOW (ref 13.0–17.0)
O2 Saturation: 100 %
O2 Saturation: 98 %
O2 Saturation: 100 %
O2 Saturation: 99 %
Patient temperature: 37
Patient temperature: 37
Patient temperature: 37.1
Patient temperature: 37.2
Potassium: 3.7 mmol/L (ref 3.5–5.1)
Potassium: 4.1 mmol/L (ref 3.5–5.1)
Potassium: 3.8 mmol/L (ref 3.5–5.1)
Potassium: 3.8 mmol/L (ref 3.5–5.1)
Sodium: 159 mmol/L — ABNORMAL HIGH (ref 135–145)
Sodium: 162 mmol/L (ref 135–145)
Sodium: 162 mmol/L (ref 135–145)
Sodium: 163 mmol/L (ref 135–145)
TCO2: 23 mmol/L (ref 22–32)
TCO2: 21 mmol/L — ABNORMAL LOW (ref 22–32)
TCO2: 26 mmol/L (ref 22–32)
TCO2: 24 mmol/L (ref 22–32)
pCO2 arterial: 36.7 mmHg (ref 32.0–48.0)
pCO2 arterial: 37 mmHg (ref 32.0–48.0)
pCO2 arterial: 38.5 mmHg (ref 32.0–48.0)
pCO2 arterial: 36.9 mmHg (ref 32.0–48.0)
pH, Arterial: 7.385 (ref 7.350–7.450)
pH, Arterial: 7.339 — ABNORMAL LOW (ref 7.350–7.450)
pH, Arterial: 7.415 (ref 7.350–7.450)
pH, Arterial: 7.398 (ref 7.350–7.450)
pO2, Arterial: 333 mmHg — ABNORMAL HIGH (ref 83.0–108.0)
pO2, Arterial: 103 mmHg (ref 83.0–108.0)
pO2, Arterial: 186 mmHg — ABNORMAL HIGH (ref 83.0–108.0)
pO2, Arterial: 138 mmHg — ABNORMAL HIGH (ref 83.0–108.0)

## 2021-01-18 LAB — CULTURE, RESPIRATORY W GRAM STAIN

## 2021-01-18 LAB — BASIC METABOLIC PANEL
BUN: 53 mg/dL — ABNORMAL HIGH (ref 6–20)
CO2: 21 mmol/L — ABNORMAL LOW (ref 22–32)
Calcium: 8.3 mg/dL — ABNORMAL LOW (ref 8.9–10.3)
Chloride: >130 mmol/L (ref 98–111)
Creatinine, Ser: 1.55 mg/dL — ABNORMAL HIGH (ref 0.61–1.24)
GFR, Estimated: 55 mL/min — ABNORMAL LOW (ref >60)
Glucose, Bld: 120 mg/dL — ABNORMAL HIGH (ref 70–99)
Potassium: 3.7 mmol/L (ref 3.5–5.1)
Sodium: 158 mmol/L — ABNORMAL HIGH (ref 135–145)

## 2021-01-18 LAB — CBC WITH DIFFERENTIAL/PLATELET
Abs Immature Granulocytes: 0.07 10*3/uL (ref 0.00–0.07)
Basophils Absolute: 0 10*3/uL (ref 0.0–0.1)
Basophils Relative: 0 %
Eosinophils Absolute: 0 10*3/uL (ref 0.0–0.5)
Eosinophils Relative: 0 %
HCT: 31.8 % — ABNORMAL LOW (ref 39.0–52.0)
Hemoglobin: 10.3 g/dL — ABNORMAL LOW (ref 13.0–17.0)
Immature Granulocytes: 1 %
Lymphocytes Relative: 9 %
Lymphs Abs: 1 10*3/uL (ref 0.7–4.0)
MCH: 30.5 pg (ref 26.0–34.0)
MCHC: 32.4 g/dL (ref 30.0–36.0)
MCV: 94.1 fL (ref 80.0–100.0)
Monocytes Absolute: 0.8 10*3/uL (ref 0.1–1.0)
Monocytes Relative: 8 %
Neutro Abs: 8.9 10*3/uL — ABNORMAL HIGH (ref 1.7–7.7)
Neutrophils Relative %: 82 %
Platelets: 122 10*3/uL — ABNORMAL LOW (ref 150–400)
RBC: 3.38 MIL/uL — ABNORMAL LOW (ref 4.22–5.81)
RDW: 15 % (ref 11.5–15.5)
WBC: 10.8 10*3/uL — ABNORMAL HIGH (ref 4.0–10.5)
nRBC: 0 % (ref 0.0–0.2)

## 2021-01-18 LAB — COMPREHENSIVE METABOLIC PANEL
ALT: 14 U/L (ref 0–44)
ALT: 15 U/L (ref 0–44)
ALT: 13 U/L (ref 0–44)
AST: 14 U/L — ABNORMAL LOW (ref 15–41)
AST: 14 U/L — ABNORMAL LOW (ref 15–41)
AST: 15 U/L (ref 15–41)
Albumin: 1.9 g/dL — ABNORMAL LOW (ref 3.5–5.0)
Albumin: 1.9 g/dL — ABNORMAL LOW (ref 3.5–5.0)
Albumin: 1.8 g/dL — ABNORMAL LOW (ref 3.5–5.0)
Alkaline Phosphatase: 46 U/L (ref 38–126)
Alkaline Phosphatase: 49 U/L (ref 38–126)
Alkaline Phosphatase: 48 U/L (ref 38–126)
Anion gap: 5 (ref 5–15)
BUN: 51 mg/dL — ABNORMAL HIGH (ref 6–20)
BUN: 53 mg/dL — ABNORMAL HIGH (ref 6–20)
BUN: 54 mg/dL — ABNORMAL HIGH (ref 6–20)
CO2: 21 mmol/L — ABNORMAL LOW (ref 22–32)
CO2: 21 mmol/L — ABNORMAL LOW (ref 22–32)
CO2: 23 mmol/L (ref 22–32)
Calcium: 7.8 mg/dL — ABNORMAL LOW (ref 8.9–10.3)
Calcium: 8.2 mg/dL — ABNORMAL LOW (ref 8.9–10.3)
Calcium: 8.3 mg/dL — ABNORMAL LOW (ref 8.9–10.3)
Chloride: 129 mmol/L — ABNORMAL HIGH (ref 98–111)
Chloride: >130 mmol/L (ref 98–111)
Chloride: >130 mmol/L (ref 98–111)
Creatinine, Ser: 1.97 mg/dL — ABNORMAL HIGH (ref 0.61–1.24)
Creatinine, Ser: 1.57 mg/dL — ABNORMAL HIGH (ref 0.61–1.24)
Creatinine, Ser: 1.55 mg/dL — ABNORMAL HIGH (ref 0.61–1.24)
GFR, Estimated: 41 mL/min — ABNORMAL LOW (ref >60)
GFR, Estimated: 54 mL/min — ABNORMAL LOW (ref >60)
GFR, Estimated: 55 mL/min — ABNORMAL LOW (ref >60)
Glucose, Bld: 101 mg/dL — ABNORMAL HIGH (ref 70–99)
Glucose, Bld: 111 mg/dL — ABNORMAL HIGH (ref 70–99)
Glucose, Bld: 118 mg/dL — ABNORMAL HIGH (ref 70–99)
Potassium: 3.7 mmol/L (ref 3.5–5.1)
Potassium: 3.5 mmol/L (ref 3.5–5.1)
Potassium: 3.6 mmol/L (ref 3.5–5.1)
Sodium: 155 mmol/L — ABNORMAL HIGH (ref 135–145)
Sodium: 156 mmol/L — ABNORMAL HIGH (ref 135–145)
Sodium: 159 mmol/L — ABNORMAL HIGH (ref 135–145)
Total Bilirubin: 0.7 mg/dL (ref 0.3–1.2)
Total Bilirubin: 0.4 mg/dL (ref 0.3–1.2)
Total Bilirubin: 0.5 mg/dL (ref 0.3–1.2)
Total Protein: 4.9 g/dL — ABNORMAL LOW (ref 6.5–8.1)
Total Protein: 5.4 g/dL — ABNORMAL LOW (ref 6.5–8.1)
Total Protein: 5.3 g/dL — ABNORMAL LOW (ref 6.5–8.1)

## 2021-01-18 LAB — CBC
HCT: 33.3 % — ABNORMAL LOW (ref 39.0–52.0)
Hemoglobin: 11 g/dL — ABNORMAL LOW (ref 13.0–17.0)
MCH: 30.7 pg (ref 26.0–34.0)
MCHC: 33 g/dL (ref 30.0–36.0)
MCV: 93 fL (ref 80.0–100.0)
Platelets: 143 10*3/uL — ABNORMAL LOW (ref 150–400)
RBC: 3.58 MIL/uL — ABNORMAL LOW (ref 4.22–5.81)
RDW: 15.3 % (ref 11.5–15.5)
WBC: 11.3 10*3/uL — ABNORMAL HIGH (ref 4.0–10.5)
nRBC: 0 % (ref 0.0–0.2)

## 2021-01-18 LAB — ECHOCARDIOGRAM COMPLETE
AR max vel: 2.51 cm2
AV Area VTI: 2.75 cm2
AV Area mean vel: 2.56 cm2
AV Mean grad: 5 mmHg
AV Peak grad: 10.1 mmHg
Ao pk vel: 1.59 m/s
Area-P 1/2: 4.19 cm2
Calc EF: 61.8 %
Height: 71 in
MV VTI: 2.51 cm2
S' Lateral: 3 cm
Single Plane A2C EF: 54.5 %
Single Plane A4C EF: 69 %
Weight: 4793.68 oz

## 2021-01-18 LAB — APTT
aPTT: 30 seconds (ref 24–36)
aPTT: 31 seconds (ref 24–36)

## 2021-01-18 LAB — GLUCOSE, CAPILLARY
Glucose-Capillary: 119 mg/dL — ABNORMAL HIGH (ref 70–99)
Glucose-Capillary: 99 mg/dL (ref 70–99)
Glucose-Capillary: 112 mg/dL — ABNORMAL HIGH (ref 70–99)
Glucose-Capillary: 104 mg/dL — ABNORMAL HIGH (ref 70–99)
Glucose-Capillary: 92 mg/dL (ref 70–99)
Glucose-Capillary: 104 mg/dL — ABNORMAL HIGH (ref 70–99)

## 2021-01-18 LAB — FIBRINOGEN
Fibrinogen: 677 mg/dL — ABNORMAL HIGH (ref 210–475)
Fibrinogen: 742 mg/dL — ABNORMAL HIGH (ref 210–475)

## 2021-01-18 LAB — CK TOTAL AND CKMB (NOT AT ARMC)
CK, MB: 4.6 ng/mL (ref 0.5–5.0)
Relative Index: INVALID (ref 0.0–2.5)
Total CK: 29 U/L — ABNORMAL LOW (ref 49–397)

## 2021-01-18 LAB — URINALYSIS, ROUTINE W REFLEX MICROSCOPIC
Glucose, UA: NEGATIVE mg/dL
Ketones, ur: NEGATIVE mg/dL
Nitrite: NEGATIVE
Protein, ur: 30 mg/dL — AB
Specific Gravity, Urine: 1.02 (ref 1.005–1.030)
pH: 5.5 (ref 5.0–8.0)

## 2021-01-18 LAB — PROTIME-INR
INR: 1.2 (ref 0.8–1.2)
INR: 1.2 (ref 0.8–1.2)
Prothrombin Time: 15.6 seconds — ABNORMAL HIGH (ref 11.4–15.2)
Prothrombin Time: 15 seconds (ref 11.4–15.2)

## 2021-01-18 LAB — URINE CULTURE: Culture: NO GROWTH

## 2021-01-18 LAB — OSMOLALITY: Osmolality: 340 mOsm/kg (ref 275–295)

## 2021-01-18 LAB — URINALYSIS, MICROSCOPIC (REFLEX): RBC / HPF: >50 RBC/hpf (ref 0–5)

## 2021-01-18 LAB — OSMOLALITY, URINE: Osmolality, Ur: 406 mOsm/kg (ref 300–900)

## 2021-01-18 LAB — TRIGLYCERIDES: Triglycerides: 186 mg/dL — ABNORMAL HIGH (ref <150)

## 2021-01-18 LAB — MAGNESIUM: Magnesium: 2.5 mg/dL — ABNORMAL HIGH (ref 1.7–2.4)

## 2021-01-18 LAB — SODIUM
Sodium: 156 mmol/L — ABNORMAL HIGH (ref 135–145)
Sodium: 158 mmol/L — ABNORMAL HIGH (ref 135–145)

## 2021-01-18 LAB — BILIRUBIN, DIRECT
Bilirubin, Direct: 0.2 mg/dL (ref 0.0–0.2)
Bilirubin, Direct: 0.2 mg/dL (ref 0.0–0.2)

## 2021-01-18 LAB — PHOSPHORUS: Phosphorus: 4.4 mg/dL (ref 2.5–4.6)

## 2021-01-18 LAB — LIPASE, BLOOD: Lipase: 35 U/L (ref 11–51)

## 2021-01-18 LAB — TROPONIN I (HIGH SENSITIVITY): Troponin I (High Sensitivity): 43 ng/L — ABNORMAL HIGH (ref <18)

## 2021-01-18 LAB — AMYLASE: Amylase: 89 U/L (ref 28–100)

## 2021-01-18 IMAGING — DX DG CHEST 1V PORT
1 series · 1 of 1 positions shown · non-contrast
Comparison: [DATE]

CLINICAL DATA: Cardiac arrest

EXAM:
PORTABLE CHEST 1 VIEW

[chest]
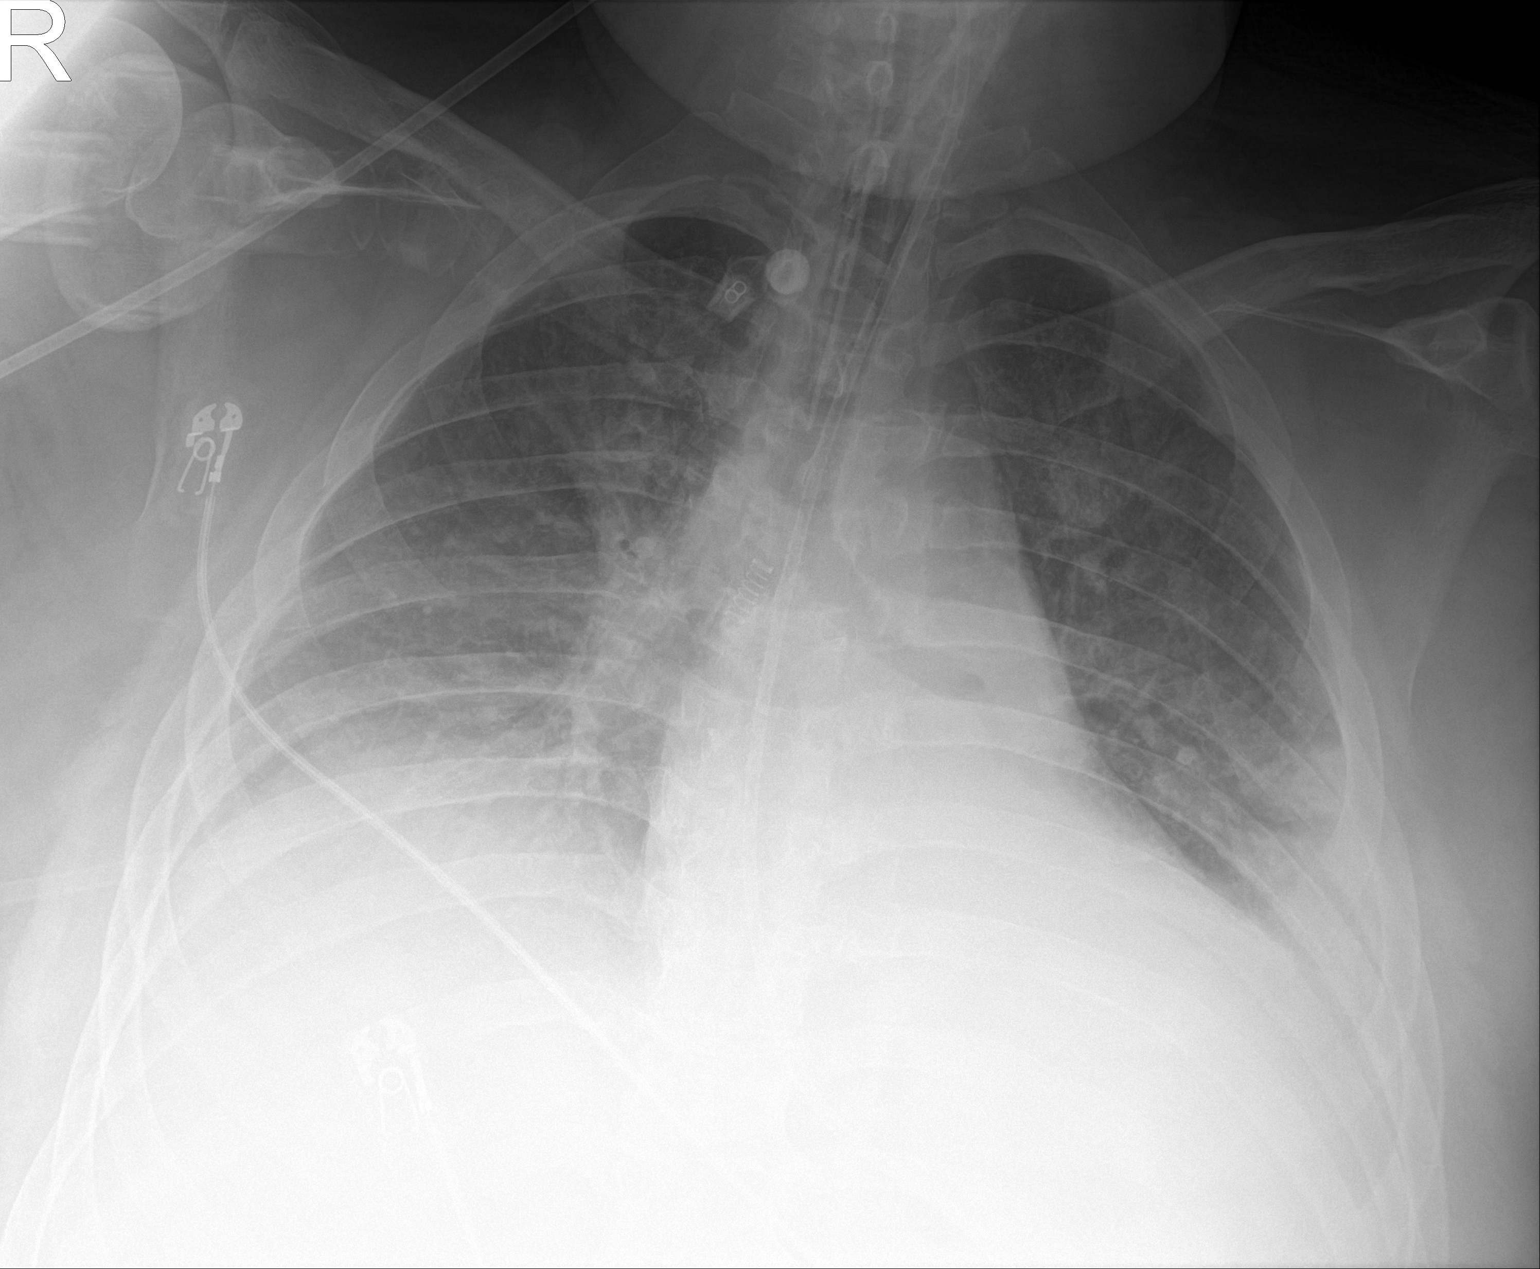

[1 of 1 positions shown; findings below may reference images not displayed]

FINDINGS: Endotracheal tube seen 4.0 cm above the carina. Nasoenteric feeding
tube extends into the upper abdomen beyond the margin of the
examination. Lung volumes are small, but are symmetric and are
stable since prior examination. Focal consolidation within the a
left costophrenic angle again noted. Retrocardiac and right basilar
opacification may relate to small bilateral pleural effusions. No
pneumothorax. Cardiac size within normal limits. Pulmonary
vascularity is normal.
IMPRESSION: Stable support tubes.

Stable pulmonary hypoinflation.

Developing consolidation within the left costophrenic angle.

Small bilateral pleural effusions.

## 2021-01-18 IMAGING — CT CT CHEST-ABD-PELV W/O CM
2 of 5 series · 13 of 46 positions shown, 15 images · non-contrast
Comparison: Chest CT [DATE]

CLINICAL DATA: Sepsis. Monitor bridge donor candidate. Post cardiac
arrest.

EXAM:
CT CHEST, ABDOMEN AND PELVIS WITHOUT CONTRAST
TECHNIQUE: Multidetector CT imaging of the chest, abdomen and pelvis was
performed following the standard protocol without IV contrast.

[Series 5: cap w/o 3.0 mm st cor · coronal · non-contrast · 1.00mm/px · 3 of 138 slices shown]
[im 46/138  soft-tissue]
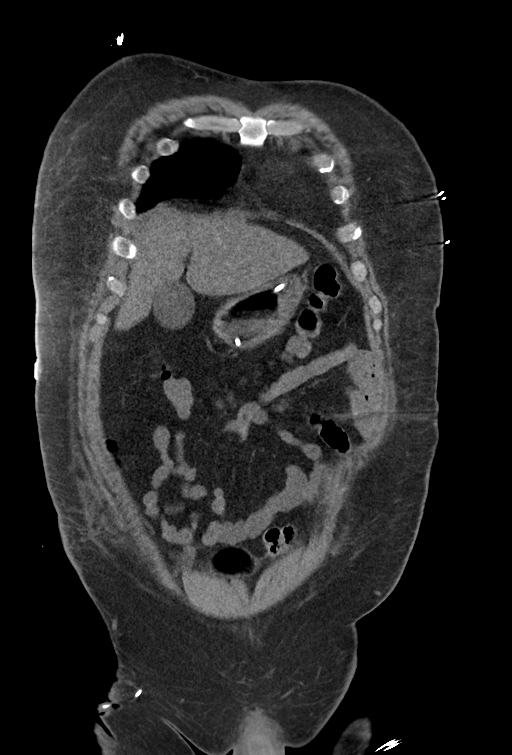
[im 61/138  soft-tissue]
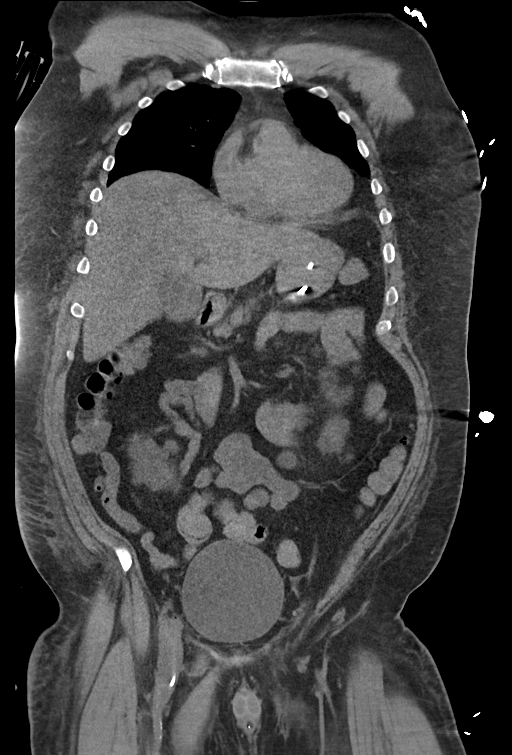
[im 77/138  soft-tissue]
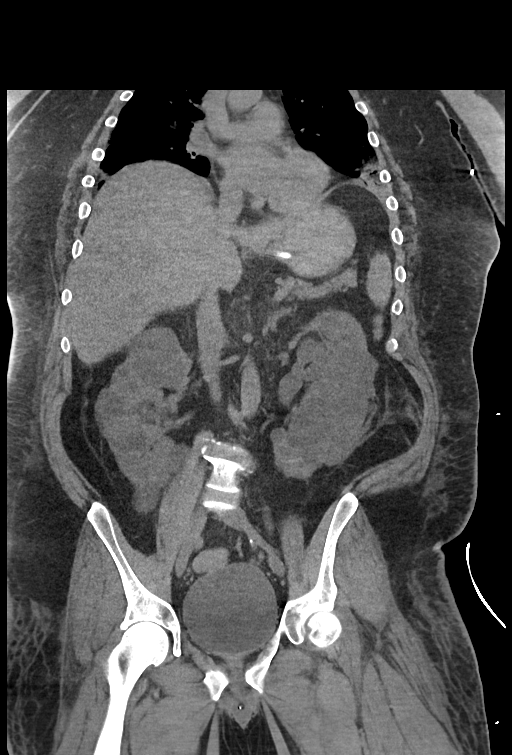

[Series 7: cap w/o 2.0 mm st · axial · non-contrast · 0.98mm/px · z∈[-145,+517]mm · 10 of 377 slices shown, 12 images]
[im 23/377  soft-tissue]
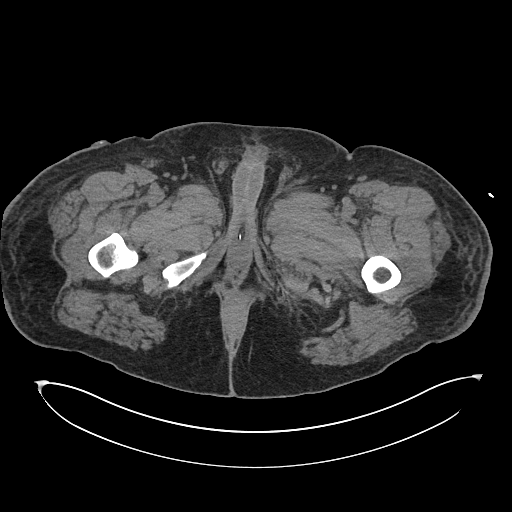
[im 23/377  bone]
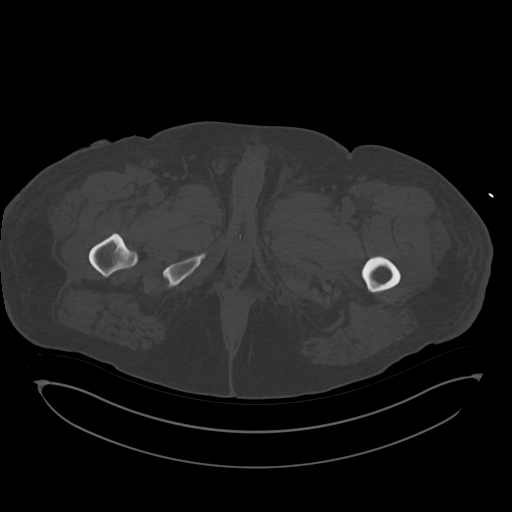
[im 67/377  soft-tissue]
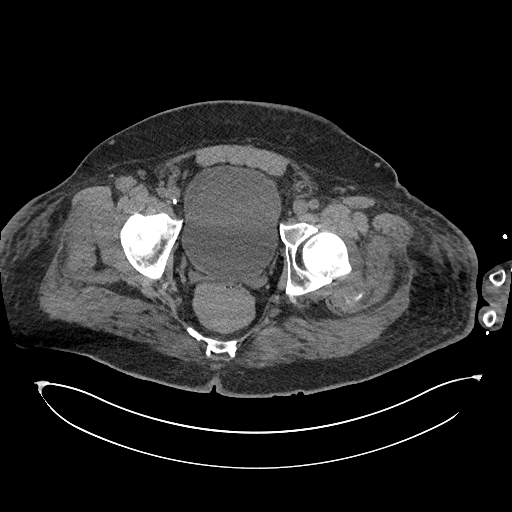
[im 111/377  soft-tissue]
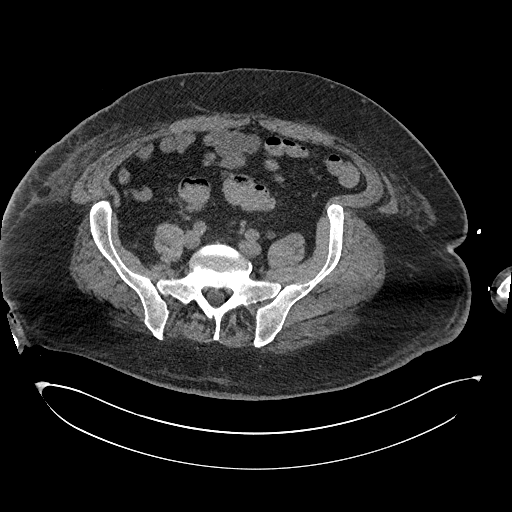
[im 133/377  soft-tissue]
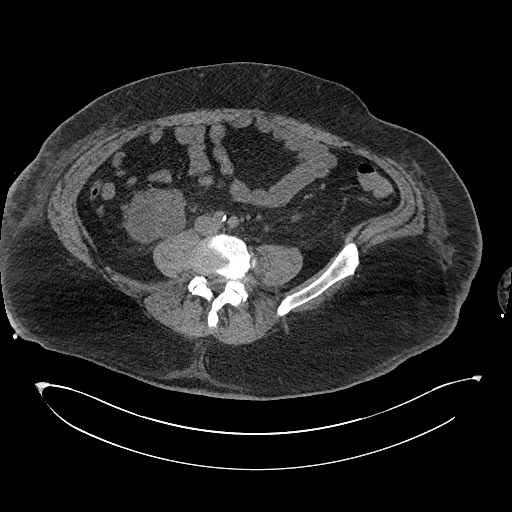
[im 177/377  soft-tissue]
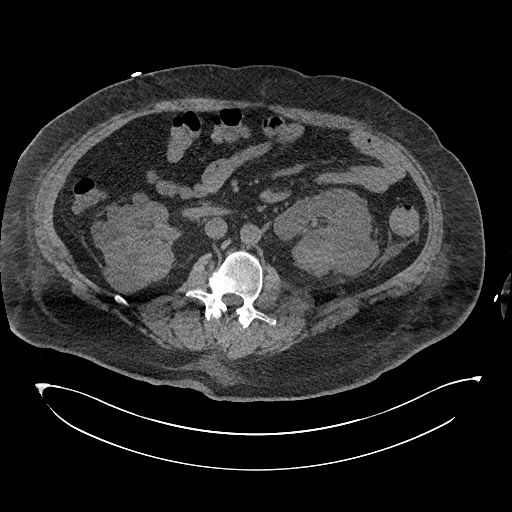
[im 200/377  soft-tissue]
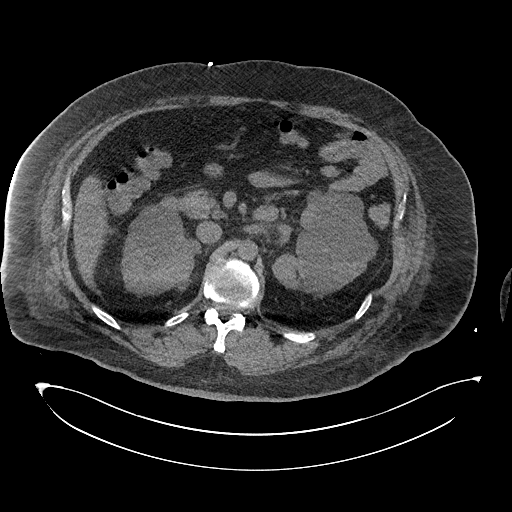
[im 244/377  soft-tissue]
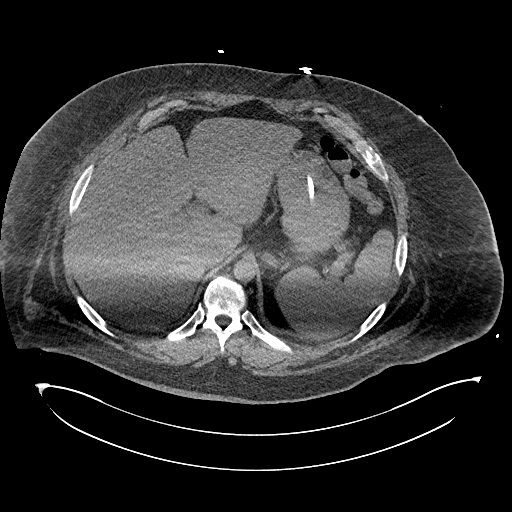
[im 288/377  soft-tissue]
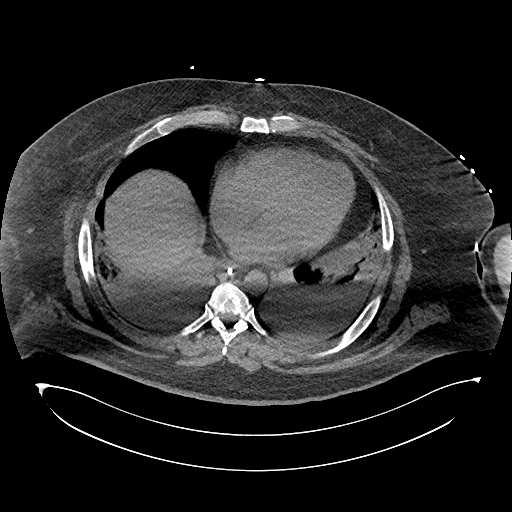
[im 310/377  soft-tissue]
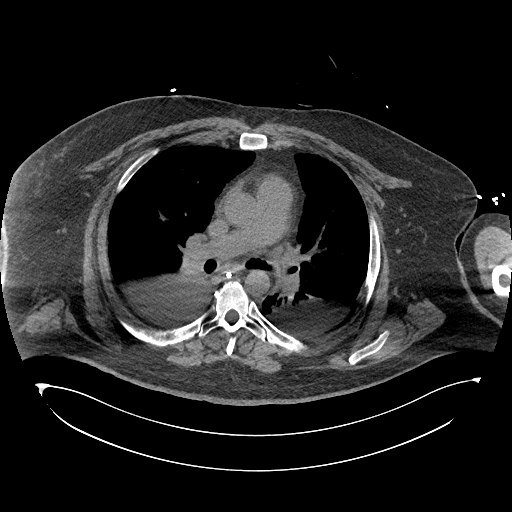
[im 310/377  bone]
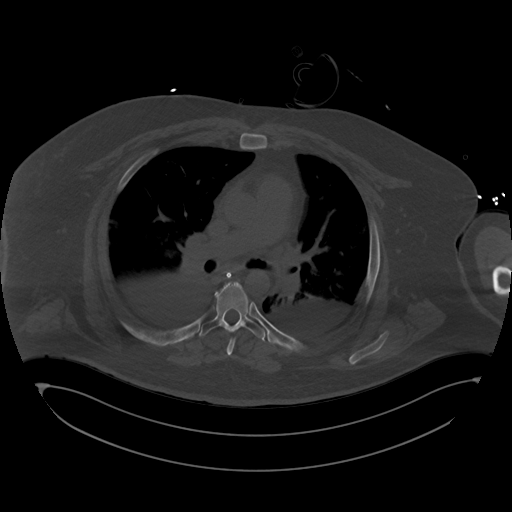
[im 354/377  soft-tissue]
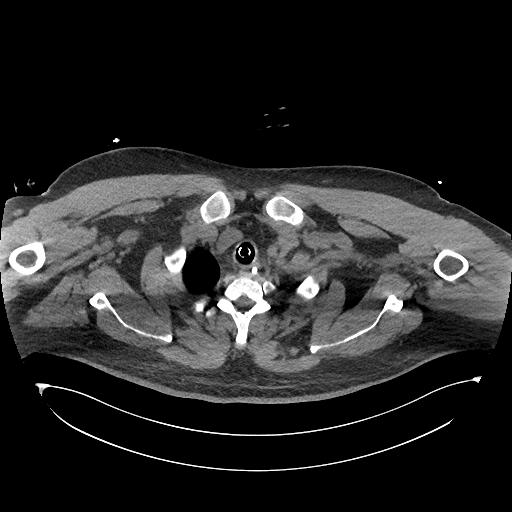

[13 of 46 positions shown; findings below may reference images not displayed]

FINDINGS: CT CHEST FINDINGS

Cardiovascular: The thoracic aorta is normal in caliber.
Conventional branching pattern from the aortic arch. Normal caliber
main pulmonary artery. The heart is normal in size. No pericardial
effusion.

Mediastinum/Nodes: No enlarged mediastinal lymph nodes. There is no
bulky hilar adenopathy on this unenhanced exam. Endotracheal tube
tip above the carina. Enteric tube decompresses the esophagus. No
visualized thyroid nodule.

Lungs/Pleura: Small to moderate bilateral pleural effusions.
Adjacent compressive atelectasis. Additional patchy ground-glass and
nodular opacities in the upper lobes and lingula. Overall improving
lung aeration from recent chest CT. Improved interlobular septal
thickening. Trachea and central bronchi are patent.

Musculoskeletal: Minimal thoracic spondylosis with endplate
spurring. There are no acute or suspicious osseous abnormalities. No
definite chest wall soft tissue abnormalities.

CT ABDOMEN PELVIS FINDINGS

Hepatobiliary: Borderline decreased hepatic density may represent
mild steatosis. No focal liver abnormality is seen. Small calcified
gallstone. No pericholecystic fat stranding or inflammation. No
biliary dilatation.

Pancreas: No pancreatic mass.  No ductal dilatation or inflammation.

Spleen: Mild splenomegaly with spleen measuring 14.3 cm cranial
caudal dimension. No focal abnormality.

Adrenals/Urinary Tract: Normal adrenal glands. Both kidneys are
enlarged and multi cystic in appearance. Greater than 10 cysts
within both kidneys, with cystic replacement of the renal
parenchyma. There is a 6 mm stone in the left proximal ureter just
beyond the ureteropelvic junction, with extrarenal pelvis
configuration versus mild hydronephrosis. 9 mm calcification in the
lower left kidney may be a parenchymal calcification or
nonobstructing stone. Mild left greater than right perinephric
edema. Punctate nonobstructing stone in the mid right kidney. No
evidence of solid renal lesion. Urinary bladder is physiologically
distended. Air-fluid level is likely related to Foley catheter
placement. Foley catheter balloon is blown up in the prostatic
urethra.

Stomach/Bowel: Enteric tube in the stomach. No gastric wall
thickening. No small bowel obstruction or inflammation. Normal
appendix. Occasional liquid stool throughout the colon without
colonic wall thickening or pericolonic edema. Mild left colonic
diverticulosis without diverticulitis.

Vascular/Lymphatic: Right femoral catheter tip in the external iliac
vein. Mild aortic and iliac atherosclerosis. No aortic aneurysm. No
portal venous or mesenteric gas. No bulky abdominopelvic adenopathy.

Reproductive: Foley catheter balloon blown up in the prostatic
urethra. Prostate otherwise unremarkable.

Other: No free air or ascites.

Musculoskeletal: Degenerative change in the lumbar spine, most
prominent degenerative disc disease at L3-L4. No focal bone lesion.
IMPRESSION: 1. Small to moderate bilateral pleural effusions with adjacent
compressive atelectasis. Additional patchy ground-glass and nodular
opacities in the upper lobes and lingula, suspicious for pneumonia
or aspiration. Overall improving lung aeration from recent chest CT.
2. Enlarged multi cystic kidneys consistent with polycystic kidney
disease.
3. A 6 mm stone in the left proximal ureter just beyond the
ureteropelvic junction with mild left hydronephrosis versus
extrarenal pelvis, stone was present on [Q8] exam and appears
chronic. Additional 9 mm calcification in the lower left kidney may
be a parenchymal calcification or nonobstructing stone.
4. Foley catheter balloon blown up in the prostatic urethra.
Recommend repositioning.
5. Mild splenomegaly.
6. Cholelithiasis without gallbladder inflammation.
7. Mild left colonic diverticulosis without diverticulitis.

Aortic Atherosclerosis ([Q8]-[Q8]).

## 2021-01-18 IMAGING — DX DG CHEST 1V PORT
1 series · 1 of 1 positions shown · non-contrast
Comparison: Previous studies including the examination done earlier
today

CLINICAL DATA: Hemoptysis, cardiac arrest

EXAM:
PORTABLE CHEST 1 VIEW

[chest]
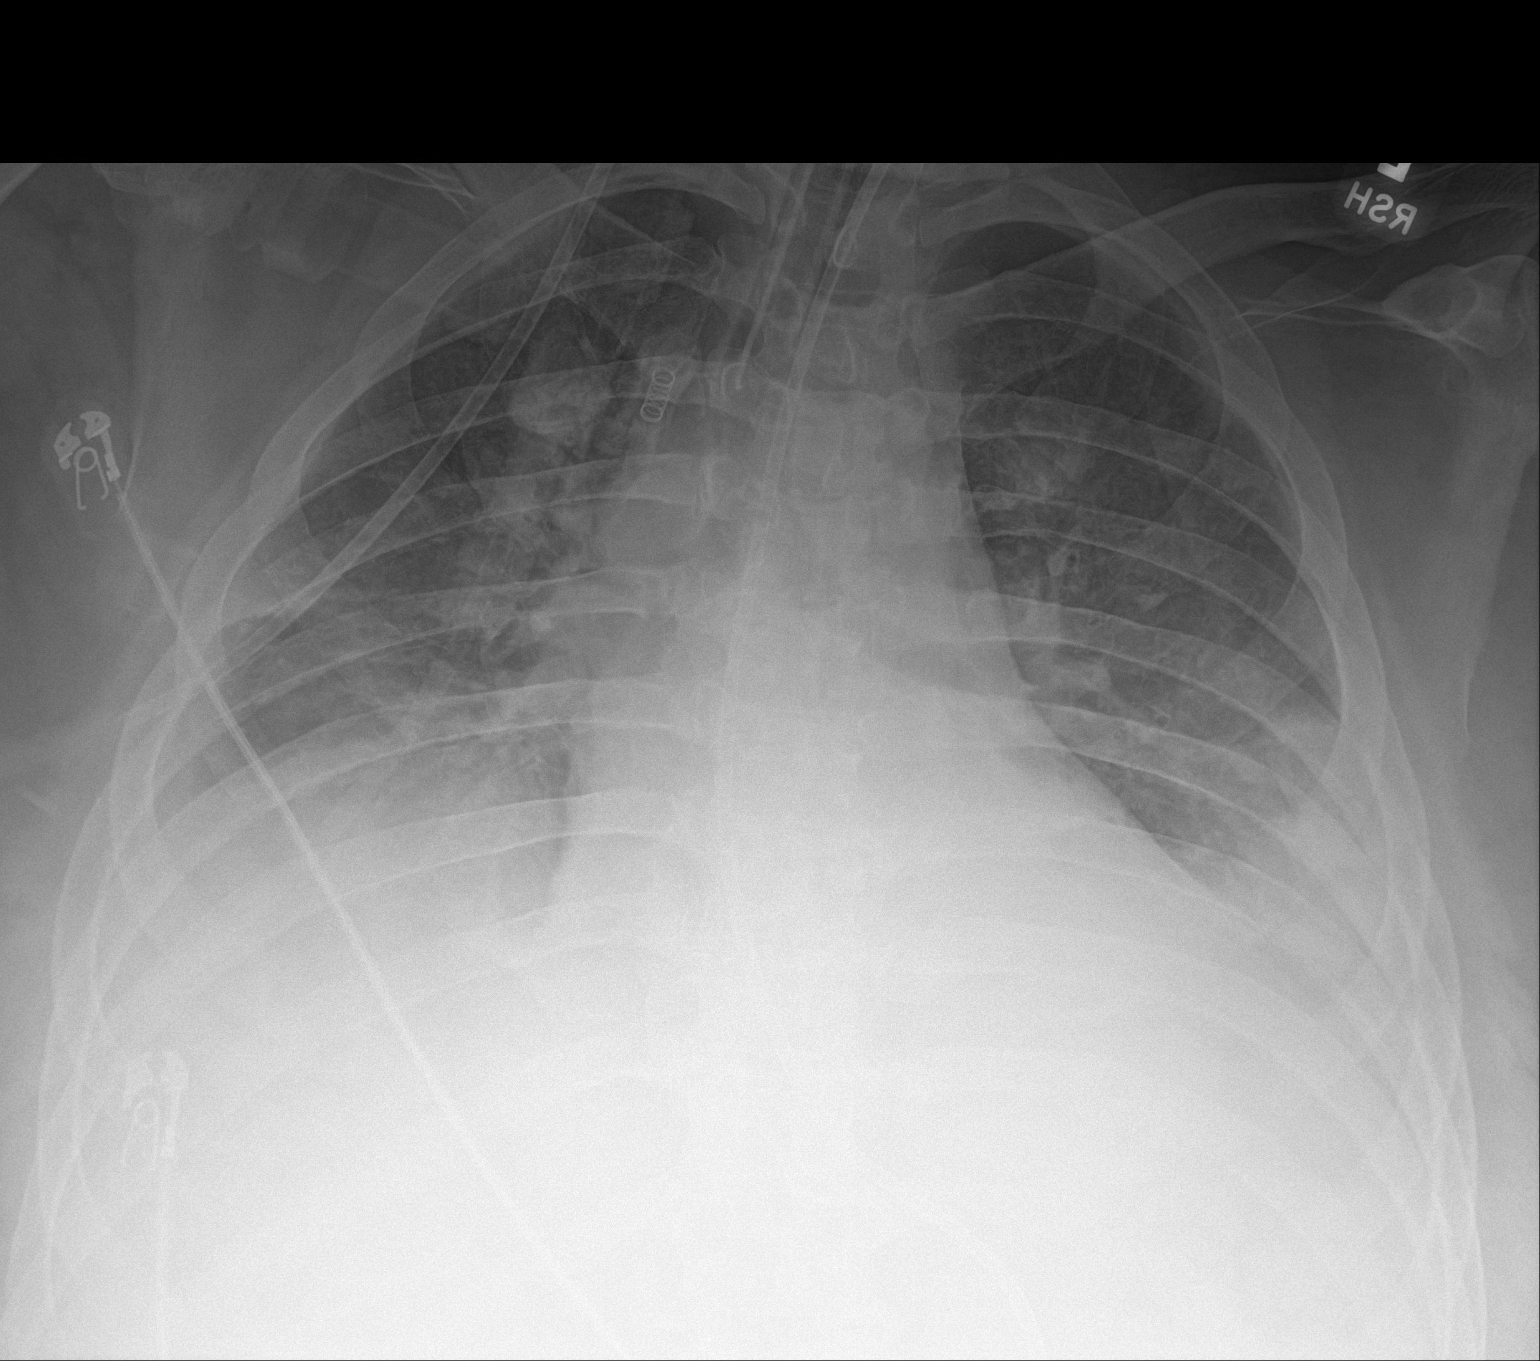

[1 of 1 positions shown; findings below may reference images not displayed]

FINDINGS: Tip of endotracheal tube is 4.5 cm above the carina. Enteric tube is
noted traversing the esophagus. Transverse diameter of heart is
slightly increased. Infiltrates are seen in both lower lung fields
obscuring the diaphragms with no significant interval change. There
is mild haziness in the mid lung fields which may be due to layering
of pleural effusions. Is no pneumothorax.
IMPRESSION: Infiltrates are seen in both lower lung fields suggesting
atelectasis/pneumonia. No significant interval changes are noted in
the infiltrates. Possible bilateral pleural effusions.

## 2021-01-18 MED ORDER — PERFLUTREN LIPID MICROSPHERE
1.0000 mL | INTRAVENOUS | Status: AC | PRN
  Administered 2021-01-17: 4 mL via INTRAVENOUS
  Filled 2021-01-18: qty 10

## 2021-01-18 MED ORDER — FUROSEMIDE 10 MG/ML IJ SOLN
40.0000 mg | Freq: Once | INTRAMUSCULAR | Status: AC
Start: 2021-01-19 — End: 2021-01-19
  Administered 2021-01-19: 40 mg via INTRAVENOUS
  Filled 2021-01-18: qty 4

## 2021-01-18 MED ORDER — ATROPINE SULFATE 1 MG/10ML IJ SOSY
PREFILLED_SYRINGE | INTRAMUSCULAR | Status: AC
  Filled 2021-01-18: qty 10

## 2021-01-18 MED ORDER — POTASSIUM CHLORIDE 20 MEQ PO PACK
60.0000 meq | PACK | Freq: Once | ORAL | Status: AC
  Administered 2021-01-18: 60 meq
  Filled 2021-01-18: qty 3

## 2021-01-18 MED ORDER — FUROSEMIDE 10 MG/ML IJ SOLN
40.0000 mg | Freq: Once | INTRAMUSCULAR | Status: AC
  Administered 2021-01-18: 40 mg via INTRAVENOUS
  Filled 2021-01-18: qty 4

## 2021-01-18 MED ORDER — POTASSIUM CHLORIDE 20 MEQ PO PACK
20.0000 meq | PACK | Freq: Once | ORAL | Status: AC
  Administered 2021-01-18: 20 meq via ORAL
  Filled 2021-01-18: qty 1

## 2021-01-18 NOTE — Progress Notes (Signed)
*  PRELIMINARY RESULTS* Echocardiogram 2D Echocardiogram has been performed.  Brent Horn 01/18/2021, 12:24 AM

## 2021-01-18 NOTE — Progress Notes (Signed)
NAME:  Brent Horn, MRN:  [id number], DOB:  1975-02-23, LOS: 4 ADMISSION DATE:  2021-02-01, CONSULTATION DATE:  02/01/2021 REFERRING MD:  Lynelle Doctor, CHIEF COMPLAINT:  hemoptysis   History of Present Illness:  46 y.o. male  with past medical history of Arthritis, Chronic back pain, Hypertension, Kidney stone, Polycystic kidney disease, Renal disorder, cocaine abuse admitted on 2021/02/01 with out of hospital cardiac arrest preceded by hemoptysis.  When he arrived in the ER he was awake with ROSC but due to worsening respiratory status he was intubated and PCCM was consulted.  Pertinent  Medical History  HTN PCKD  Significant Hospital Events: Including procedures, antibiotic start and stop dates in addition to other pertinent events   12/31 admission 1/1 CT showing L MCA stroke with midline shift, hypertonic saline Dc'd, GOC discussion and Palliative Care consulted Bronch 01/15/2021 for large amount of bloody secretions 01/16/2021 Made DNR with no escalation of care after family meeting, plan if for additional family meeting 01/17/2021 at 12 noon for further discussion  1/4 Family meeting yesterday, plan for compassionate extubation 1/5  Interim History / Subjective:  Continuing supportive care [name] talking to family  Objective   Blood pressure (!) 146/81, pulse (!) 55, temperature 98.4 F (36.9 C), temperature source Bladder, resp. rate (!) 28, height 5\' 11"  (1.803 m), weight (!) 136.7 kg, SpO2 97 %.    Vent Mode: PRVC FiO2 (%):  [50 %-100 %] 70 % Set Rate:  [28 bmp] 28 bmp Vt Set:  [500 mL-600 mL] 600 mL PEEP:  [5 cmH20-12 cmH20] 5 cmH20 Plateau Pressure:  [22 cmH20-30 cmH20] 23 cmH20   Intake/Output Summary (Last 24 hours) at 01/18/2021 03/18/2021 Last data filed at 01/18/2021 0800 Gross per 24 hour  Intake 3936.52 ml  Output 4100 ml  Net -163.48 ml    Filed Weights   01/15/21 0445 01/17/21 0600 01/18/21 0401  Weight: 127.4 kg 135.9 kg (!) 136.7 kg   General:  critically  ill-appearing M, intubated and sedated HEENT: MM pink/moist, pupils fixed Neuro: unresponsive, not triggering breaths, no corneals or gag, on sedation CV: s1s2 rrr, no m/r/g PULM:  mechanical breath sounds bilaterally, synchronous with vent GI: soft, bsx4 active  Extremities: warm/dry, 2+ edema  Skin: no rashes or lesions    Resolved Hospital Problem list   N/a  Assessment & Plan:     OOH prolonged cardiac arrest presumed due to the aspiration event with ROSC Global anoxic injury Cocaine positive  some report that he was groggy and able to answer questions per EDP but quickly decompensated. Plan  Continue supportive care today, family and friends visiting and plan for compassionate extubation tomorrow Wean pressors for MAP > 65 mm Hg Telemetry Normothermia Maintain Mag > 2  and K > 4  ARDS secondary to massive hemoptysis in setting of cocaine abuse Stomach was clean with NGT on admission so nasal source unlikely, suspect this was acute alveolitis from cocaine inhalation; no evidence of ongoing alveolar bleeding on serial lavage 01/15/2021 Plan -continue full support today as above, stop nimbex, continue sedation --Maintain full vent support with SAT/SBT as tolerated -titrate Vent setting to maintain SpO2 greater than or equal to 90%. -HOB elevated 30 degrees. -Plateau pressures less than 30 cm H20.  -Follow chest x-ray, ABG prn.   -Bronchial hygiene and RT/bronchodilator protocol.   Bilateral CVAs- complete L MCA, scattered R cerebral infarcts, cocaine effect vs. Embolic phenomenon vs. Effects from prolonged severe hypoxemia and shock Cytotoxic edema/L>R midline shift-  from  L MCA stroke, on 3% saline as salvage Plan  Continue hypertonic  saline>> held today for Na above goal  Check serum Na Q 6    Heavy smoker, heavy drinker Plan Thiamine and Folate  Fentanyl and Propofol   Acute kidney injury -supportive care as above   Best Practice (right click and  "Reselect all SmartList Selections" daily)   Diet/type: TF DVT prophylaxis: hold for now given size of stroke GI prophylaxis: PPI Lines: yes and it is still needed Foley:  Yes, and it is still needed Code Status:  DNR Last date of multidisciplinary goals of care discussion [01/18/21, transition to comfort care 1/5]  CRITICAL CARE Performed by: Darcella Gasman Jillaine Waren   Total critical care time: 35 minutes  Critical care time was exclusive of separately billable procedures and treating other patients.  Critical care was necessary to treat or prevent imminent or life-threatening deterioration.  Critical care was time spent personally by me on the following activities: development of treatment plan with patient and/or surrogate as well as nursing, discussions with consultants, evaluation of patient's response to treatment, examination of patient, obtaining history from patient or surrogate, ordering and performing treatments and interventions, ordering and review of laboratory studies, ordering and review of radiographic studies, pulse oximetry and re-evaluation of patient's condition.   Darcella Gasman Sarabeth Benton, PA-C [facility] See Amion for pager If no response to pager , please call [phone] until 7pm After 7:00 pm call Elink  [phone]

## 2021-01-18 NOTE — Progress Notes (Signed)
0700 - Report received from PM RN.  All questions answered.  Safety checks performed.  Lines and drips verified. Hand hygiene performed before/after each pt contact. 0800 - Assessment & Rx. 0840 Mickel Baas, CCM PA rounded on pt. [phone] - Lab called a critical Cl- level of >130 mmol/L.  Na+ also supratherapeutic at 156 mmol/L.  K+ 3.5 mmol/L.  Mickel Baas, NP notified.  3% NS D/C'd.  Orders for K+ placed. 0900 - Brandy, Summer, and friend arrived to visit. All questions answered.  Brandy requested to have pt's hospital bracelet for Summer.  Theadora Rama also requested to speak with an Set designer member face to face. [place] with [employer] advised of same.  She advised that Kizzie Ide is the family liaison person today and would come have a chat with Theadora Rama.  [place] left. Bell from [employer] came by.  She advised that she would call Brandy later. 50 - Friends of the pt stopped by to visit. 1052 - Morning rounds. 77 - Dr. Loanne Drilling rounded.  Nimbex stopped. 1400 - Palliative RN rounded. [place], Chaplain rounded. 1522 - Lab called for a critical level of Cl- > 130 mmol/L.   Mickel Baas, CCM PA notified via secure chat. [phone] - Report given to PM RN.  All questions answered.

## 2021-01-18 NOTE — Progress Notes (Signed)
Recruitment maneuver completed with a pep of 20 for 30 seconds.  O2 challenge for 30 minutes started with an FIO2 of 100%.  RN will obtain ABG.

## 2021-01-18 NOTE — Progress Notes (Signed)
eLink Physician-Brief Progress Note Patient Name: Brent Horn DOB: [birth year] MRN: [id number]   Date of Service  01/18/2021  HPI/Events of Note  Serum OSMO 340, 3 %  Saline gtt rate reduced earlier.  eICU Interventions  Continue to monitor without  intervention at this time.        [name] 01/18/2021, 1:04 AM

## 2021-01-18 NOTE — Progress Notes (Signed)
Daily Progress Note   Patient Name: Brent Horn       Date: 01/18/2021 DOB: 07-13-1975  Age: 46 y.o. MRN#: [id number] Attending Physician: Brent Cutter, MD Primary Care Physician: Patient, No Pcp Per (Inactive) Admit Date: 01/18/21  Reason for Consultation/Follow-up: Terminal Care  Patient Profile/HPI:  46 y.o. male  with past medical history of Arthritis, Chronic back pain, Hypertension, Kidney stone, Polycystic kidney disease, Renal disorder, cocaine abuse admitted on 01-18-21 with out of hospital cardiac arrest preceded by hemoptysis.  When he arrived in the ER he was awake with ROSC but due to worsening respiratory status he was intubated and PCCM was consulted.   CT scan on 1/1 showed left MCA stroke with midline shift, hypertonic saline was DC'd and recommend goals of care conversation.  Neurology is on board.   PMT was consulted for goals of care conversations.  Decision made 1/3 to transition patient to comfort tentatively on 1/5.  Subjective: Chart reviewed- noted labs and testing workup has begun for possible organ donation.  Patient's brother was at bedside and requested update.  Spoke with patient's spouse- Brent Horn- confirmed plan for comfort, discontinuing life support and allowing for continuation of dying process. Additionally, patient is now under care of [facility] and hope is for organ donation surgery tomorrow.  Per Brent Horn- ok to share information with patient's brother.  Answered patient's brother's questions related to his diagnosis, prognosis, and what treatments had been offered and decided upon.   Review of Systems  Unable to perform ROS: Acuity of condition    Physical Exam Vitals and nursing note reviewed.  Constitutional:      Appearance: He is  ill-appearing.  Cardiovascular:     Rate and Rhythm: Bradycardia present.  Pulmonary:     Comments: Intubated with full support           Vital Signs: BP (!) 144/67    Pulse (!) 49    Temp 98.8 F (37.1 C)    Resp (!) 28    Ht 5\' 11"  (1.803 m)    Wt (!) 136.7 kg    SpO2 99%    BMI 42.03 kg/m  SpO2: SpO2: 99 % O2 Device: O2 Device: Ventilator O2 Flow Rate:    Intake/output summary:  Intake/Output Summary (Last 24 hours) at 01/18/2021 1629 Last data  filed at 01/18/2021 1600 Gross per 24 hour  Intake 3728.77 ml  Output 5795 ml  Net -2066.23 ml   LBM: Last BM Date: 01/18/21 Baseline Weight: Weight: 129.6 kg Most recent weight: Weight: (!) 136.7 kg       Palliative Assessment/Data: PPS: 10%      Patient Active Problem List   Diagnosis Date Noted   Anoxic brain injury (HCC)    Cerebral embolism with cerebral infarction 01/16/2021   Palliative care by specialist    Goals of care, counseling/discussion    ARDS (adult respiratory distress syndrome) (HCC)    Acute respiratory failure with hypoxia (HCC)    Cocaine abuse (HCC)    Hemoptysis    Cardiac arrest (HCC) 12/17/2020    Palliative Care Assessment & Plan    Assessment/Recommendations/Plan  Anoxic brain injury, cerebral embolism and infarction in the setting of ARDS, alveoloar hemmorhage- not surviveable- plan for comfort, likely to undergo organ donation surgery tomorrow, [facility] will coordinate with primary team   Code Status: DNR  Prognosis:  Hours - Days  Discharge Planning: Anticipated Hospital Death  Care plan was discussed with family and care team  Thank you for allowing the Palliative Medicine Team to assist in the care of this patient.     Greater than 50%  of this time was spent counseling and coordinating care related to the above assessment and plan.  Ocie Bob, AGNP-C Palliative Medicine   Please contact Palliative Medicine Team phone at [phone] for questions and concerns.

## 2021-01-18 NOTE — Progress Notes (Signed)
O2 recruitment completed with a peep of 20 for 30 seconds per donor services.

## 2021-01-18 NOTE — Progress Notes (Signed)
Recruitment   done and oxygen challenge started, will obtain ABG in 30 minutes.

## 2021-01-18 NOTE — Progress Notes (Signed)
This chaplain is present for F/U spiritual care. The Pt. neighbor-Doug is at the bedside during the visit. The chaplain listened reflectively as Gala Romney talked about his friendship with the Pt. and the value of memories as Gala Romney lives with the loss.  The chaplain accepted Doug's request for intercessory prayer. The chaplain checked in with the RN-Elizabeth.  Chaplain Stephanie Acre [phone]

## 2021-01-18 NOTE — Progress Notes (Signed)
[facility] Progress Note Patient Name: Brent Horn DOB: 1976-01-05 MRN: [id number]   Date of Service  01/18/2021  HPI/Events of Note  Foley balloon in the urethra. CT abdomen / pelvis has resulted  eICU Interventions  Order entered to discontinue current Foley an replace with a new catheter. CT reviewed.        Kerry Kass Harlene Petralia 01/18/2021, 1:56 AM

## 2021-01-18 NOTE — Discharge Summary (Incomplete)
Physician Discharge Summary         Patient ID: Brent PlaneJeffrey Horn MRN: [id number] DOB/AGE: Feb 09, 1975 46 y.o.  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 male  with past medical history of Arthritis, Chronic back pain, Hypertension, Kidney stone, Polycystic kidney disease, Renal disorder, cocaine abuse admitted on Jun 26, 2020 with out of hospital cardiac arrest preceded by hemoptysis.  When he arrived in the ER he was awake with ROSC but due to worsening respiratory status he was intubated and PCCM was consulted.  Head imaging revealed L MCA CVA and cytotoxic edema with 7mm midline shift     Discharge Plan by Active Problems       Significant Hospital tests/ studies    1/1 CT Chest>  1. Since the previous day's chest radiograph, the left central venous catheter has been removed. There is no evidence of an aortic or other vascular injury. 2. Bilateral pleural effusions with dependent lung opacities both confluent and ground-glass as well as small nodular opacities. Findings support multifocal pneumonia.  1/1 CT soft tissue neck>No acute abnormality identified in the neck.  1/1 Head CT> 1. Large acute left MCA territory infarct also involving part of the occipital lobe. 2. No findings for associated hemorrhage. 3. 5 mm of midline shift. No findings for downward transtentorial herniation. 4. Paranasal sinus disease.  1/2 MRI/MRA Brain>  1. Acute, complete left MCA territory infarct. Cytotoxic edema causes 7 mm of midline shift. 2. Small acute cortical infarcts at the right cerebral convexity.   Intracranial MRA:   Occluded left MCA.  The  other intracranial vessels are unremarkable.   Neck MRA:  Unremarkable.   CT chest> IMPRESSION: 1. Small to moderate bilateral pleural effusions with adjacent compressive atelectasis. Additional patchy ground-glass and nodular opacities in the upper lobes and lingula, suspicious for pneumonia or aspiration. Overall improving lung aeration from recent chest CT. 2. Enlarged multi cystic kidneys consistent with polycystic kidney disease. 3. A 6 mm stone in the left proximal ureter just beyond the ureteropelvic junction with mild left hydronephrosis versus extrarenal pelvis, stone was present on 2017 exam and appears chronic. Additional 9 mm calcification in the lower left kidney may be a parenchymal calcification or nonobstructing stone. 4. Foley catheter balloon blown up in the prostatic urethra. Recommend repositioning. 5. Mild splenomegaly. 6. Cholelithiasis without gallbladder inflammation. 7. Mild left colonic diverticulosis without diverticulitis.  Procedures    Culture data/antimicrobials      Consults      Discharge Exam: BP 122/68    Pulse (!) 51    Temp 98.8 F (37.1 C)    Resp (!) 28    Ht 5\' 11"  (1.803 m)    Wt (!) 136.7 kg    SpO2 96%    BMI 42.03 kg/m   ****** Labs at discharge   Lab Results  Component Value Date   CREATININE 1.55 (H) 01/18/2021   BUN 53 (H) 01/18/2021   NA 162 (HH) 01/18/2021   K 3.8 01/18/2021   CL >130 (HH) 01/18/2021   CO2 21 (L) 01/18/2021   Lab Results  Component Value Date   WBC 11.3 (H) 01/18/2021   HGB 10.2 (L) 01/18/2021   HCT 30.0 (  L) 01/18/2021   MCV 93.0 01/18/2021   PLT 143 (L) 01/18/2021   Lab Results  Component Value Date   ALT 15 01/18/2021   AST 14 (L) 01/18/2021   ALKPHOS 49 01/18/2021   BILITOT 0.4 01/18/2021   Lab Results  Component Value Date   INR 1.2 01/18/2021   INR 1.2 01/17/2021   INR 1.1 12/27/2020    Current radiological studies    DG CHEST PORT 1 VIEW  Result Date: 01/18/2021 CLINICAL  DATA:  Hemoptysis, cardiac arrest EXAM: PORTABLE CHEST 1 VIEW COMPARISON:  Previous studies including the examination done earlier today FINDINGS: Tip of endotracheal tube is 4.5 cm above the carina. Enteric tube is noted traversing the esophagus. Transverse diameter of heart is slightly increased. Infiltrates are seen in both lower lung fields obscuring the diaphragms with no significant interval change. There is mild haziness in the mid lung fields which may be due to layering of pleural effusions. Is no pneumothorax. IMPRESSION: Infiltrates are seen in both lower lung fields suggesting atelectasis/pneumonia. No significant interval changes are noted in the infiltrates. Possible bilateral pleural effusions. Electronically Signed   By: Ernie Avena M.D.   On: 01/18/2021 08:16   DG CHEST PORT 1 VIEW  Result Date: 01/18/2021 CLINICAL DATA:  Cardiac arrest EXAM: PORTABLE CHEST 1 VIEW COMPARISON:  01/17/2021 FINDINGS: Endotracheal tube seen 4.0 cm above the carina. Nasoenteric feeding tube extends into the upper abdomen beyond the margin of the examination. Lung volumes are small, but are symmetric and are stable since prior examination. Focal consolidation within the a left costophrenic angle again noted. Retrocardiac and right basilar opacification may relate to small bilateral pleural effusions. No pneumothorax. Cardiac size within normal limits. Pulmonary vascularity is normal. IMPRESSION: Stable support tubes. Stable pulmonary hypoinflation. Developing consolidation within the left costophrenic angle. Small bilateral pleural effusions. Electronically Signed   By: Helyn Numbers M.D.   On: 01/18/2021 03:04   DG CHEST PORT 1 VIEW  Result Date: 01/17/2021 CLINICAL DATA:  Cardiac arrest. EXAM: PORTABLE CHEST 1 VIEW COMPARISON:  Chest x-ray 01/16/2021. FINDINGS: Endotracheal tube tip is 4.7 cm above the carina. Enteric tube extends below the diaphragm. The cardiomediastinal silhouette is within normal  limits. There is a small left pleural effusion which is increased. There are bibasilar patchy opacities which have increased. There is no pneumothorax or acute fracture. IMPRESSION: 1. Increasing small left pleural effusion. 2. Increasing bibasilar infiltrates. Electronically Signed   By: Darliss Cheney M.D.   On: 01/17/2021 19:52   ECHOCARDIOGRAM COMPLETE  Result Date: 01/18/2021    ECHOCARDIOGRAM REPORT   Patient Name:   Arrow Doto Date of Exam: 01/17/2021 Medical Rec #:  [id number]         Height:       71.0 in Accession #:    [id number]        Weight:       299.6 lb Date of Birth:  06/09/1975         BSA:          2.504 m Patient Age:    45 years          BP:           1129/72 mmHg Patient Gender: M                 HR:           49 bpm. Exam Location:  Inpatient Procedure: 2D Echo, Cardiac Doppler and Color Doppler Indications:  [facility]                 need disc  History:        Patient has prior history of Echocardiogram examinations, most                 recent 01/15/2021. Echo for [employer].  Sonographer:    Mikki Harbor Referring Phys: [id number] GRACE E BOWSER  Sonographer Comments: Echo performed with patient supine and on artificial respirator and patient is morbidly obese. IMPRESSIONS  1. Left ventricular ejection fraction, by estimation, is 65 to 70%. The left ventricle has normal function. The left ventricle has no regional wall motion abnormalities. Left ventricular diastolic parameters were normal.  2. Right ventricular systolic function is normal. The right ventricular size is normal.  3. The mitral valve is normal in structure. Trivial mitral valve regurgitation.  4. The aortic valve is tricuspid. Aortic valve regurgitation is not visualized. No aortic stenosis is present. Comparison(s): Compared to prior echo on 01/15/21, the LVEF now appears normal (previously reported as 45-50%). FINDINGS  Left Ventricle: Left ventricular ejection fraction, by estimation, is 65 to 70%. The left  ventricle has normal function. The left ventricle has no regional wall motion abnormalities. Definity contrast agent was given IV to delineate the left ventricular  endocardial borders. The left ventricular internal cavity size was normal in size. There is no left ventricular hypertrophy. Left ventricular diastolic parameters were normal. Right Ventricle: The right ventricular size is normal. No increase in right ventricular wall thickness. Right ventricular systolic function is normal. Left Atrium: Left atrial size was normal in size. Right Atrium: Right atrial size was normal in size. Pericardium: There is no evidence of pericardial effusion. Mitral Valve: The mitral valve is normal in structure. Trivial mitral valve regurgitation. MV peak gradient, 3.8 mmHg. The mean mitral valve gradient is 1.0 mmHg. Tricuspid Valve: The tricuspid valve is normal in structure. Tricuspid valve regurgitation is trivial. Aortic Valve: The aortic valve is tricuspid. Aortic valve regurgitation is not visualized. No aortic stenosis is present. Aortic valve mean gradient measures 5.0 mmHg. Aortic valve peak gradient measures 10.1 mmHg. Aortic valve area, by VTI measures 2.75  cm. Pulmonic Valve: The pulmonic valve was normal in structure. Pulmonic valve regurgitation is trivial. Aorta: The aortic root and ascending aorta are structurally normal, with no evidence of dilitation. Venous: IVC assessment for right atrial pressure unable to be performed due to mechanical ventilation. IAS/Shunts: The atrial septum is grossly normal.  LEFT VENTRICLE PLAX 2D LVIDd:         5.30 cm     Diastology LVIDs:         3.00 cm     LV e' medial:    10.20 cm/s LV PW:         0.90 cm     LV E/e' medial:  8.5 LV IVS:        1.00 cm     LV e' lateral:   13.30 cm/s LVOT diam:     2.00 cm     LV E/e' lateral: 6.5 LV SV:         92 LV SV Index:   37 LVOT Area:     3.14 cm  LV Volumes (MOD) LV vol d, MOD A2C: 63.8 ml LV vol d, MOD A4C: 70.1 ml LV vol s, MOD  A2C: 29.0 ml LV vol s, MOD A4C: 21.7 ml LV SV MOD A2C:     34.8 ml LV  SV MOD A4C:     70.1 ml LV SV MOD BP:      42.8 ml RIGHT VENTRICLE RV Basal diam:  3.60 cm RV Mid diam:    2.80 cm RV S prime:     8.81 cm/s TAPSE (M-mode): 2.6 cm LEFT ATRIUM             Index        RIGHT ATRIUM           Index LA diam:        4.00 cm 1.60 cm/m   RA Area:     13.60 cm LA Vol (A2C):   55.9 ml 22.33 ml/m  RA Volume:   32.10 ml  12.82 ml/m LA Vol (A4C):   60.8 ml 24.28 ml/m LA Biplane Vol: 59.4 ml 23.72 ml/m  AORTIC VALVE                    PULMONIC VALVE AV Area (Vmax):    2.51 cm     PV Vmax:       0.85 m/s AV Area (Vmean):   2.56 cm     PV Peak grad:  2.9 mmHg AV Area (VTI):     2.75 cm AV Vmax:           159.00 cm/s AV Vmean:          99.400 cm/s AV VTI:            0.335 m AV Peak Grad:      10.1 mmHg AV Mean Grad:      5.0 mmHg LVOT Vmax:         127.00 cm/s LVOT Vmean:        80.900 cm/s LVOT VTI:          0.293 m LVOT/AV VTI ratio: 0.87  AORTA Ao Root diam: 2.60 cm Ao Asc diam:  3.30 cm MITRAL VALVE               TRICUSPID VALVE MV Area (PHT): 4.19 cm    TR Peak grad:   24.6 mmHg MV Area VTI:   2.51 cm    TR Vmax:        248.00 cm/s MV Peak grad:  3.8 mmHg MV Mean grad:  1.0 mmHg    SHUNTS MV Vmax:       0.98 m/s    Systemic VTI:  0.29 m MV Vmean:      45.6 cm/s   Systemic Diam: 2.00 cm MV Decel Time: 181 msec MV E velocity: 86.90 cm/s MV A velocity: 76.50 cm/s MV E/A ratio:  1.14 Laurance Flatten MD Electronically signed by Laurance Flatten MD Signature Date/Time: 01/18/2021/11:19:23 AM    Final    CT CHEST ABDOMEN PELVIS WO CONTRAST  Result Date: 01/18/2021 CLINICAL DATA:  Sepsis. 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 Post cardiac arrest. EXAM: CT CHEST, ABDOMEN AND PELVIS WITHOUT CONTRAST TECHNIQUE: Multidetector CT imaging of the chest, abdomen and pelvis was performed following the standard protocol without IV contrast. COMPARISON:  Chest CT 01/15/2021 FINDINGS: CT CHEST FINDINGS Cardiovascular: The thoracic  aorta is normal in caliber. Conventional branching pattern from the aortic arch. Normal caliber main pulmonary artery. The heart is normal in size. No pericardial effusion. Mediastinum/Nodes: No enlarged mediastinal lymph nodes. There is no bulky hilar adenopathy on this unenhanced exam. Endotracheal tube tip above the carina. Enteric tube decompresses the esophagus. No visualized thyroid nodule. Lungs/Pleura: Small to moderate bilateral pleural effusions. Adjacent compressive atelectasis.  Additional patchy ground-glass and nodular opacities in the upper lobes and lingula. Overall improving lung aeration from recent chest CT. Improved interlobular septal thickening. Trachea and central bronchi are patent. Musculoskeletal: Minimal thoracic spondylosis with endplate spurring. There are no acute or suspicious osseous abnormalities. No definite chest wall soft tissue abnormalities. CT ABDOMEN PELVIS FINDINGS Hepatobiliary: Borderline decreased hepatic density may represent mild steatosis. No focal liver abnormality is seen. Small calcified gallstone. No pericholecystic fat stranding or inflammation. No biliary dilatation. Pancreas: No pancreatic mass.  No ductal dilatation or inflammation. Spleen: Mild splenomegaly with spleen measuring 14.3 cm cranial caudal dimension. No focal abnormality. Adrenals/Urinary Tract: Normal adrenal glands. Both kidneys are enlarged and multi cystic in appearance. Greater than 10 cysts within both kidneys, with cystic replacement of the renal parenchyma. There is a 6 mm stone in the left proximal ureter just beyond the ureteropelvic junction, with extrarenal pelvis configuration versus mild hydronephrosis. 9 mm calcification in the lower left kidney may be a parenchymal calcification or nonobstructing stone. Mild left greater than right perinephric edema. Punctate nonobstructing stone in the mid right kidney. No evidence of solid renal lesion. Urinary bladder is physiologically distended.  Air-fluid level is likely related to Foley catheter placement. Foley catheter balloon is blown up in the prostatic urethra. Stomach/Bowel: Enteric tube in the stomach. No gastric wall thickening. No small bowel obstruction or inflammation. Normal appendix. Occasional liquid stool throughout the colon without colonic wall thickening or pericolonic edema. Mild left colonic diverticulosis without diverticulitis. Vascular/Lymphatic: Right femoral catheter tip in the external iliac vein. Mild aortic and iliac atherosclerosis. No aortic aneurysm. No portal venous or mesenteric gas. No bulky abdominopelvic adenopathy. Reproductive: Foley catheter balloon blown up in the prostatic urethra. Prostate otherwise unremarkable. Other: No free air or ascites. Musculoskeletal: Degenerative change in the lumbar spine, most prominent degenerative disc disease at L3-L4. No focal bone lesion. IMPRESSION: 1. Small to moderate bilateral pleural effusions with adjacent compressive atelectasis. Additional patchy ground-glass and nodular opacities in the upper lobes and lingula, suspicious for pneumonia or aspiration. Overall improving lung aeration from recent chest CT. 2. Enlarged multi cystic kidneys consistent with polycystic kidney disease. 3. A 6 mm stone in the left proximal ureter just beyond the ureteropelvic junction with mild left hydronephrosis versus extrarenal pelvis, stone was present on 2017 exam and appears chronic. Additional 9 mm calcification in the lower left kidney may be a parenchymal calcification or nonobstructing stone. 4. Foley catheter balloon blown up in the prostatic urethra. Recommend repositioning. 5. Mild splenomegaly. 6. Cholelithiasis without gallbladder inflammation. 7. Mild left colonic diverticulosis without diverticulitis. Aortic Atherosclerosis (ICD10-I70.0). Electronically Signed   By: Narda RutherfordMelanie  Sanford M.D.   On: 01/18/2021 01:10    Disposition:        Allergies as of 01/18/2021   No Known  Allergies   [facility] must be completed [facility]        Follow-up appointment   *** Discharge Condition:    {condition:18240}  Physician Statement:   The Patient was personally examined, the discharge assessment and plan has been personally reviewed and I agree with ACNP Babcock's assessment and plan. *** minutes of time have been dedicated to discharge assessment, planning and discharge instructions.   SignedDarcella Gasman: Torian Quintero R Cambria Osten 01/18/2021, 6:05 PM

## 2021-01-18 NOTE — Progress Notes (Signed)
Recruitment maneuver completed with a peep of 20 for 30 seconds per donor services.

## 2021-01-19 LAB — TROPONIN I (HIGH SENSITIVITY): Troponin I (High Sensitivity): 36 ng/L — ABNORMAL HIGH (ref <18)

## 2021-01-19 LAB — CBC
HCT: 34.5 % — ABNORMAL LOW (ref 39.0–52.0)
Hemoglobin: 11.3 g/dL — ABNORMAL LOW (ref 13.0–17.0)
MCH: 30.5 pg (ref 26.0–34.0)
MCHC: 32.8 g/dL (ref 30.0–36.0)
MCV: 93.2 fL (ref 80.0–100.0)
Platelets: 151 10*3/uL (ref 150–400)
RBC: 3.7 MIL/uL — ABNORMAL LOW (ref 4.22–5.81)
RDW: 15.4 % (ref 11.5–15.5)
WBC: 9.1 10*3/uL (ref 4.0–10.5)
nRBC: 0 % (ref 0.0–0.2)

## 2021-01-19 LAB — URINALYSIS, ROUTINE W REFLEX MICROSCOPIC
Bilirubin Urine: NEGATIVE
Glucose, UA: NEGATIVE mg/dL
Ketones, ur: NEGATIVE mg/dL
Nitrite: NEGATIVE
Protein, ur: 30 mg/dL — AB
RBC / HPF: >50 RBC/hpf — ABNORMAL HIGH (ref 0–5)
Specific Gravity, Urine: 1.016 (ref 1.005–1.030)
pH: 5 (ref 5.0–8.0)

## 2021-01-19 LAB — CK TOTAL AND CKMB (NOT AT ARMC)
CK, MB: 1.8 ng/mL (ref 0.5–5.0)
Relative Index: INVALID (ref 0.0–2.5)
Total CK: 27 U/L — ABNORMAL LOW (ref 49–397)

## 2021-01-19 LAB — MAGNESIUM: Magnesium: 2.3 mg/dL (ref 1.7–2.4)

## 2021-01-19 LAB — CULTURE, BLOOD (ROUTINE X 2)
Culture: NO GROWTH
Culture: NO GROWTH
Special Requests: ADEQUATE
Special Requests: ADEQUATE

## 2021-01-19 LAB — GLUCOSE, CAPILLARY
Glucose-Capillary: 104 mg/dL — ABNORMAL HIGH (ref 70–99)
Glucose-Capillary: 107 mg/dL — ABNORMAL HIGH (ref 70–99)

## 2021-01-19 LAB — AMYLASE: Amylase: 69 U/L (ref 28–100)

## 2021-01-19 LAB — LIPASE, BLOOD: Lipase: 33 U/L (ref 11–51)

## 2021-01-19 LAB — PHOSPHORUS: Phosphorus: 4.4 mg/dL (ref 2.5–4.6)

## 2021-01-19 SURGERY — SURGICAL PROCUREMENT, ORGAN
Anesthesia: General

## 2021-01-19 MED ORDER — GLYCOPYRROLATE 0.2 MG/ML IJ SOLN: 0.2000 mg | INTRAMUSCULAR | Status: DC | PRN

## 2021-01-19 MED ORDER — CHLORHEXIDINE GLUCONATE CLOTH 2 % EX PADS: 6.0000 | MEDICATED_PAD | Freq: Every day | CUTANEOUS | Status: DC

## 2021-01-19 MED ORDER — FENTANYL BOLUS VIA INFUSION
100.0000 ug | INTRAVENOUS | Status: DC | PRN
  Filled 2021-01-19: qty 100

## 2021-01-19 MED ORDER — GLYCOPYRROLATE 1 MG PO TABS
1.0000 mg | ORAL_TABLET | ORAL | Status: DC | PRN
  Filled 2021-01-19: qty 1

## 2021-01-19 MED ORDER — DEXTROSE 5 % IV SOLN: INTRAVENOUS | Status: DC

## 2021-01-19 MED ORDER — DIPHENHYDRAMINE HCL 50 MG/ML IJ SOLN
25.0000 mg | INTRAMUSCULAR | Status: DC | PRN
  Administered 2021-01-19: 25 mg via INTRAVENOUS
  Filled 2021-01-19: qty 1

## 2021-01-19 MED ORDER — POLYVINYL ALCOHOL 1.4 % OP SOLN: 1.0000 [drp] | Freq: Four times a day (QID) | OPHTHALMIC | Status: DC | PRN

## 2021-01-19 MED ORDER — GLYCOPYRROLATE 0.2 MG/ML IJ SOLN
0.2000 mg | INTRAMUSCULAR | Status: DC | PRN
  Administered 2021-01-19: 0.2 mg via INTRAVENOUS
  Filled 2021-01-19: qty 1

## 2021-01-20 ENCOUNTER — Telehealth: Payer: Self-pay | Admitting: Pulmonary Disease

## 2021-01-20 LAB — CULTURE, BAL-QUANTITATIVE W GRAM STAIN
Culture: 50000 — AB
Gram Stain: NONE SEEN

## 2021-01-20 NOTE — Telephone Encounter (Signed)
Called and spoke with patient's wife, Brent Horn, provided information regarding time it took patient to pass away after he was taken off the ventilator.  She then wanted to know what caused the MI and Stroke and brain injury.  The patient has a 48 year old daughter and she is trying to ease his daughter's mind.  I asked her if she had a list of questions that could be addressed at one time and she said that was her only other question.  I let her know that Dr. Loanne Drilling is in clinic seeing patients and that once we hear back from her we will call her back.  She verbalized understanding.  Dr. Loanne Drilling, please advise.  Thank you.

## 2021-01-20 NOTE — Telephone Encounter (Signed)
Got it, will call the family

## 2021-01-20 NOTE — Telephone Encounter (Signed)
Please advise on note below.   

## 2021-01-20 NOTE — Telephone Encounter (Signed)
He was taken off the ventilator at 1:14 PM and passed away at 2:54. Time spent before passing away was 90 minutes.

## 2021-01-20 NOTE — Telephone Encounter (Signed)
Brent Horn, can you please address?

## 2021-01-22 LAB — CULTURE, BLOOD (ROUTINE X 2)
Culture: NO GROWTH
Culture: NO GROWTH
Special Requests: ADEQUATE
Special Requests: ADEQUATE

## 2021-02-15 NOTE — Death Summary Note (Addendum)
DEATH SUMMARY   Patient Details  Name: Brent Horn MRN: [id number] DOB: [birth year]  Admission/Discharge Information   Admit Date:  01-26-2021  Date of Death: Date of Death: January 31, 2021  Time of Death: Time of Death: 08-Mar-1452  Length of Stay: 5  Referring Physician: Patient, No Pcp Per (Inactive)   Reason(s) for Hospitalization  Cardiac arrest  Diagnoses  Preliminary cause of death: Defer to medical examiner Secondary Diagnoses (including complications and co-morbidities):  Principal Problem:   Cardiac arrest [facility]) Active Problems:   Cerebral embolism with cerebral infarction   Palliative care by specialist   Goals of care, counseling/discussion   ARDS (adult respiratory distress syndrome) ([place])   Acute respiratory failure with hypoxia ([place])   Cocaine abuse ([place])   Hemoptysis   Anoxic brain injury ([place]) Hypomagnesia Hyponatremia  Brief Hospital Course (including significant findings, care, treatment, and services provided and events leading to death)  Brent Horn is a 46 y.o. year old male with cocaine abuse, polycystic kidney disease, HTN who was admitted for OOH cardiac arrest preceded by hemoptysis. Hemoptysis suspected to be related to cocaine use. Hospital course complicated by left MCA stroke, ARDS secondary to hemoptysis. Palliative care involved and patient transitioned to DNR 01/16/21. He was evaluated for organ donation however not a candidate. Compassionate extubation and patient expired at 2:54 PM at 2021-01-31. Cause of death to be determined by medical examiner.  Pertinent Labs and Studies  Significant Diagnostic Studies DG Abd 1 View  Result Date: 01/15/2021 CLINICAL DATA:  Screen for metal prior to MRI. EXAM: ABDOMEN - 1 VIEW COMPARISON:  Abdominal x-ray 06/18/2014. FINDINGS: Enteric tube tip projects over the distal stomach. Bowel-gas pattern is nonobstructive. Right femoral central venous catheter tip projects over the right pelvis. Rectal catheter present. No  other radiopaque foreign bodies are identified. There is a 6 mm rounded calcification projecting over the left abdomen. IMPRESSION: 1. No unexpected radiopaque foreign body. 2. Enteric tube, right femoral catheter and rectal catheter in place. 3. 6 mm calcification in the left abdomen, indeterminate. Urinary tract calculus can not be excluded. Electronically Signed   By: Ronney Asters M.D.   On: 01/15/2021 21:11   CT HEAD WO CONTRAST (5MM)  Result Date: 01/15/2021 CLINICAL DATA:  Unresponsive.  History of CPR. EXAM: CT HEAD WITHOUT CONTRAST TECHNIQUE: Contiguous axial images were obtained from the base of the skull through the vertex without intravenous contrast. COMPARISON:  None. FINDINGS: Brain: There is a large acute left MCA territory infarct also involving some of the occipital lobe which could be due to a fetal PCA or border zone MCA PCA infarct also. No findings for associated hemorrhage. There is mass effect on the left lateral ventricle and midline shift of 5 mm. No findings for downward transtentorial herniation. Vascular: Scattered vascular calcifications. No obvious hyperdense MCA. Skull: No skull fracture or bone lesions. Sinuses/Orbits: Near complete opacification of the left maxillary sinus, extensive mucoperiosteal thickening involving the right maxillary sinus and some fluid in both halves of the sphenoid sinus. Mastoid air cells and middle ear cavities are clear. Other: No scalp lesions or scalp hematoma. IMPRESSION: 1. Large acute left MCA territory infarct also involving part of the occipital lobe. 2. No findings for associated hemorrhage. 3. 5 mm of midline shift. No findings for downward transtentorial herniation. 4. Paranasal sinus disease. These results were called by telephone at the time of interpretation on 01/15/2021 at 2:34 pm to provider [facility] , who verbally acknowledged these results. Electronically Signed   By:  Marijo Sanes M.D.   On: 01/15/2021 14:34   CT SOFT TISSUE NECK  WO CONTRAST  Result Date: 01/15/2021 CLINICAL DATA:  Soft tissue swelling. EXAM: CT NECK WITHOUT CONTRAST TECHNIQUE: Multidetector CT imaging of the neck was performed following the standard protocol without intravenous contrast. COMPARISON:  None. FINDINGS: Pharynx and larynx: No gross swelling or mass although assessment is limited by collapse of the pharyngeal and laryngeal soft tissues around the endotracheal and enteric tubes as well as by the absence of IV contrast. No retropharyngeal fluid collection. Salivary glands: No inflammation, mass, or stone. Thyroid: Unremarkable. Lymph nodes: No enlarged or suspicious lymph nodes in the neck. Vascular: Limited assessment in the absence of IV contrast. Limited intracranial: More fully evaluated on today's head CT. Visualized orbits: Unremarkable. Mastoids and visualized paranasal sinuses: Complete or near complete opacification of the left maxillary sinus, anterior left ethmoid air cells, and a hypoplastic left frontal sinus. Moderate circumferential mucosal thickening in the right maxillary sinus. Milder mucosal thickening and small volume fluid in the right greater than left sphenoid sinuses. Clear mastoid air cells. Skeleton: Dental caries.  Mild cervical spondylosis. Upper chest: More fully evaluated on today's chest CT. Other: None. IMPRESSION: No acute abnormality identified in the neck. Electronically Signed   By: Logan Bores M.D.   On: 01/15/2021 15:11   CT CHEST WO CONTRAST  Result Date: 01/15/2021 CLINICAL DATA:  Hemoptysis.  Soft tissue swelling. EXAM: CT CHEST WITHOUT CONTRAST TECHNIQUE: Multidetector CT imaging of the chest was performed following the standard protocol without IV contrast. COMPARISON:  Current chest radiograph. FINDINGS: Cardiovascular: Heart is normal in size and configuration. No pericardial effusion. Great vessels are normal in caliber. No evidence of aortic injury or atherosclerosis. Mediastinum/Nodes: No mediastinal hematoma.  No neck base, mediastinal or hilar masses or enlarged lymph nodes. Trachea and esophagus unremarkable. Endotracheal tube tip projects 2.3 cm above the carina. Nasal/orogastric tube passes below the diaphragm well into the stomach. Lungs/Pleura: Small to moderate bilateral pleural effusions. There is associated dependent can fluent and adjacent hazy ground-glass opacities as well as small ill-defined nodular opacities. Mild interstitial thickening noted in the upper lobes. More anterior aspects of the lungs are clear. No pneumothorax. Upper Abdomen: No acute findings. Poorly defined low-attenuation masses in the upper poles the visualized kidneys consistent with cysts and suggesting polycystic kidneys. Small gallstone. Musculoskeletal: No fracture or acute finding.  No bone lesion. IMPRESSION: 1. Since the previous day's chest radiograph, the left central venous catheter has been removed. There is no evidence of an aortic or other vascular injury. 2. Bilateral pleural effusions with dependent lung opacities both confluent and ground-glass as well as small nodular opacities. Findings support multifocal pneumonia. Electronically Signed   By: Lajean Manes M.D.   On: 01/15/2021 14:17   MR ANGIO HEAD WO CONTRAST  Result Date: 01/16/2021 CLINICAL DATA:  Stroke follow-up, determine embolic source. EXAM: MRI HEAD WITHOUT CONTRAST MRA HEAD WITHOUT CONTRAST MRA NECK WITHOUT CONTRAST TECHNIQUE: Multiplanar, multiecho pulse sequences of the brain and surrounding structures were obtained without intravenous contrast. Angiographic images of the Circle of Willis were obtained using MRA technique without intravenous contrast. Angiographic images of the neck were obtained using MRA technique without intravenous contrast. Carotid stenosis measurements (when applicable) are obtained utilizing NASCET criteria, using the distal internal carotid diameter as the denominator. COMPARISON:  Head CT 1 day ago FINDINGS: MRI HEAD FINDINGS  Brain: Confluent restricted diffusion in the left MCA territory which extends to a degree into the  upper occipital lobe. Small patchy infarcts along the right cerebral convexity. No hemorrhage, hydrocephalus, or tumor like finding. Cytotoxic edema causes midline shift of 7 mm. No entrapment. No pre-existing infarct. Vascular: See below Skull and upper cervical spine: Normal marrow signal Sinuses/Orbits: Mucosal thickening with central debris at the left maxillary sinus. MRA HEAD FINDINGS The carotid arteries are smoothly contoured and widely patent. Left M1 occlusion with faint M2 branch reconstitution. No additional proximal embolism is seen. The vertebral and basilar arteries are smoothly contoured and widely patent. Fetal type right PCA. MRA NECK FINDINGS The covered major vessels are widely patent with no ulceration or beading. Antegrade arterial flow in the neck. The aorta is was not covered IMPRESSION: Brain MRI: 1. Acute, complete left MCA territory infarct. Cytotoxic edema causes 7 mm of midline shift. 2. Small acute cortical infarcts at the right cerebral convexity. Intracranial MRA: Occluded left MCA.  The other intracranial vessels are unremarkable. Neck MRA: Unremarkable. Electronically Signed   By: Jorje Guild M.D.   On: 01/16/2021 06:49   MR ANGIO NECK WO CONTRAST  Result Date: 01/16/2021 CLINICAL DATA:  Stroke follow-up, determine embolic source. EXAM: MRI HEAD WITHOUT CONTRAST MRA HEAD WITHOUT CONTRAST MRA NECK WITHOUT CONTRAST TECHNIQUE: Multiplanar, multiecho pulse sequences of the brain and surrounding structures were obtained without intravenous contrast. Angiographic images of the Circle of Willis were obtained using MRA technique without intravenous contrast. Angiographic images of the neck were obtained using MRA technique without intravenous contrast. Carotid stenosis measurements (when applicable) are obtained utilizing NASCET criteria, using the distal internal carotid diameter as  the denominator. COMPARISON:  Head CT 1 day ago FINDINGS: MRI HEAD FINDINGS Brain: Confluent restricted diffusion in the left MCA territory which extends to a degree into the upper occipital lobe. Small patchy infarcts along the right cerebral convexity. No hemorrhage, hydrocephalus, or tumor like finding. Cytotoxic edema causes midline shift of 7 mm. No entrapment. No pre-existing infarct. Vascular: See below Skull and upper cervical spine: Normal marrow signal Sinuses/Orbits: Mucosal thickening with central debris at the left maxillary sinus. MRA HEAD FINDINGS The carotid arteries are smoothly contoured and widely patent. Left M1 occlusion with faint M2 branch reconstitution. No additional proximal embolism is seen. The vertebral and basilar arteries are smoothly contoured and widely patent. Fetal type right PCA. MRA NECK FINDINGS The covered major vessels are widely patent with no ulceration or beading. Antegrade arterial flow in the neck. The aorta is was not covered IMPRESSION: Brain MRI: 1. Acute, complete left MCA territory infarct. Cytotoxic edema causes 7 mm of midline shift. 2. Small acute cortical infarcts at the right cerebral convexity. Intracranial MRA: Occluded left MCA.  The other intracranial vessels are unremarkable. Neck MRA: Unremarkable. Electronically Signed   By: Jorje Guild M.D.   On: 01/16/2021 06:49   MR BRAIN WO CONTRAST  Result Date: 01/16/2021 CLINICAL DATA:  Stroke follow-up, determine embolic source. EXAM: MRI HEAD WITHOUT CONTRAST MRA HEAD WITHOUT CONTRAST MRA NECK WITHOUT CONTRAST TECHNIQUE: Multiplanar, multiecho pulse sequences of the brain and surrounding structures were obtained without intravenous contrast. Angiographic images of the Circle of Willis were obtained using MRA technique without intravenous contrast. Angiographic images of the neck were obtained using MRA technique without intravenous contrast. Carotid stenosis measurements (when applicable) are obtained  utilizing NASCET criteria, using the distal internal carotid diameter as the denominator. COMPARISON:  Head CT 1 day ago FINDINGS: MRI HEAD FINDINGS Brain: Confluent restricted diffusion in the left MCA territory which extends to a degree into  the upper occipital lobe. Small patchy infarcts along the right cerebral convexity. No hemorrhage, hydrocephalus, or tumor like finding. Cytotoxic edema causes midline shift of 7 mm. No entrapment. No pre-existing infarct. Vascular: See below Skull and upper cervical spine: Normal marrow signal Sinuses/Orbits: Mucosal thickening with central debris at the left maxillary sinus. MRA HEAD FINDINGS The carotid arteries are smoothly contoured and widely patent. Left M1 occlusion with faint M2 branch reconstitution. No additional proximal embolism is seen. The vertebral and basilar arteries are smoothly contoured and widely patent. Fetal type right PCA. MRA NECK FINDINGS The covered major vessels are widely patent with no ulceration or beading. Antegrade arterial flow in the neck. The aorta is was not covered IMPRESSION: Brain MRI: 1. Acute, complete left MCA territory infarct. Cytotoxic edema causes 7 mm of midline shift. 2. Small acute cortical infarcts at the right cerebral convexity. Intracranial MRA: Occluded left MCA.  The other intracranial vessels are unremarkable. Neck MRA: Unremarkable. Electronically Signed   By: Jorje Guild M.D.   On: 01/16/2021 06:49   DG CHEST PORT 1 VIEW  Result Date: 01/18/2021 CLINICAL DATA:  Hemoptysis, cardiac arrest EXAM: PORTABLE CHEST 1 VIEW COMPARISON:  Previous studies including the examination done earlier today FINDINGS: Tip of endotracheal tube is 4.5 cm above the carina. Enteric tube is noted traversing the esophagus. Transverse diameter of heart is slightly increased. Infiltrates are seen in both lower lung fields obscuring the diaphragms with no significant interval change. There is mild haziness in the mid lung fields which may  be due to layering of pleural effusions. Is no pneumothorax. IMPRESSION: Infiltrates are seen in both lower lung fields suggesting atelectasis/pneumonia. No significant interval changes are noted in the infiltrates. Possible bilateral pleural effusions. Electronically Signed   By: Elmer Picker M.D.   On: 01/18/2021 08:16   DG CHEST PORT 1 VIEW  Result Date: 01/18/2021 CLINICAL DATA:  Cardiac arrest EXAM: PORTABLE CHEST 1 VIEW COMPARISON:  01/17/2021 FINDINGS: Endotracheal tube seen 4.0 cm above the carina. Nasoenteric feeding tube extends into the upper abdomen beyond the margin of the examination. Lung volumes are small, but are symmetric and are stable since prior examination. Focal consolidation within the a left costophrenic angle again noted. Retrocardiac and right basilar opacification may relate to small bilateral pleural effusions. No pneumothorax. Cardiac size within normal limits. Pulmonary vascularity is normal. IMPRESSION: Stable support tubes. Stable pulmonary hypoinflation. Developing consolidation within the left costophrenic angle. Small bilateral pleural effusions. Electronically Signed   By: Fidela Salisbury M.D.   On: 01/18/2021 03:04   DG CHEST PORT 1 VIEW  Result Date: 01/17/2021 CLINICAL DATA:  Cardiac arrest. EXAM: PORTABLE CHEST 1 VIEW COMPARISON:  Chest x-ray 01/16/2021. FINDINGS: Endotracheal tube tip is 4.7 cm above the carina. Enteric tube extends below the diaphragm. The cardiomediastinal silhouette is within normal limits. There is a small left pleural effusion which is increased. There are bibasilar patchy opacities which have increased. There is no pneumothorax or acute fracture. IMPRESSION: 1. Increasing small left pleural effusion. 2. Increasing bibasilar infiltrates. Electronically Signed   By: Ronney Asters M.D.   On: 01/17/2021 19:52   DG Chest Port 1 View  Result Date: 01/16/2021 CLINICAL DATA:  ARDS. EXAM: PORTABLE CHEST 1 VIEW COMPARISON:  January 14, 2021.  FINDINGS: The heart size and mediastinal contours are within normal limits. Endotracheal and nasogastric tubes are unchanged in position. Stable bilateral lung opacities are noted concerning for edema or pneumonia. The visualized skeletal structures are unremarkable. IMPRESSION: Stable  support apparatus.  Stable bilateral lung opacities. Electronically Signed   By: Marijo Conception M.D.   On: 01/16/2021 09:10   DG CHEST PORT 1 VIEW  Result Date: 12/27/2020 CLINICAL DATA:  Central line placement. EXAM: PORTABLE CHEST 1 VIEW COMPARISON:  Radiograph of same day. FINDINGS: The heart size and mediastinal contours are within normal limits. Endotracheal and nasogastric tubes are unchanged. Bilateral lung opacities are noted, right greater than left, concerning for pneumonia or possibly edema. There is been interval placement of left-sided catheter with distal tip projected over the left side of the midthoracic spine which is not typical for for jugular placement. The visualized skeletal structures are unremarkable. IMPRESSION: Interval placement of left-sided catheter with distal tip projected over left side of midthoracic spine which is not typical for internal jugular placement. This is concerning for possible inadvertent arterial puncture with tip in thoracic aorta. These results will be called to the ordering clinician or representative by the Radiologist Assistant, and communication documented in the PACS or zVision Dashboard. Electronically Signed   By: Marijo Conception M.D.   On: 01/09/2021 17:40   DG Chest Portable 1 View  Result Date: 12/17/2020 CLINICAL DATA:  Patient presents from home unresponsive. EXAM: PORTABLE CHEST 1 VIEW COMPARISON:  10/06/2016 FINDINGS: Bilateral hazy airspace lung opacities are the central prominence. No convincing pleural effusion or pneumothorax on the semi-erect study. Cardiac silhouette normal in size.  No mediastinal or hilar masses. Endotracheal tube tip projects 3.4 cm  above the Carina. Nasal/orogastric tube passes well below the diaphragm, into the stomach and below the included field of view. IMPRESSION: 1. Hazy bilateral airspace lung opacities, pattern suspected to be pulmonary edema, although multifocal pneumonia is also possible. 2. Well-positioned endotracheal tube and nasal/orogastric tube. Electronically Signed   By: Lajean Manes M.D.   On: 01/04/2021 15:58   DG Abd Portable 1V  Result Date: 01/16/2021 CLINICAL DATA:  Feeding tube placement. EXAM: PORTABLE ABDOMEN - 1 VIEW COMPARISON:  January 15, 2021. FINDINGS: The bowel gas pattern is normal. Distal tip of feeding tube is seen in expected position of distal stomach. No radio-opaque calculi or other significant radiographic abnormality are seen. IMPRESSION: Distal tip of feeding tube seen in expected position of distal stomach. Electronically Signed   By: Marijo Conception M.D.   On: 01/16/2021 09:52   ECHOCARDIOGRAM COMPLETE  Result Date: 01/18/2021    ECHOCARDIOGRAM REPORT   Patient Name:   Jabier Umble Date of Exam: 01/17/2021 Medical Rec #:  [id number]         Height:       71.0 in Accession #:    [id number]        Weight:       299.6 lb Date of Birth:  01-26-1975         BSA:          2.504 m Patient Age:    73 years          BP:           1129/72 mmHg Patient Gender: M                 HR:           49 bpm. Exam Location:  Inpatient Procedure: 2D Echo, Cardiac Doppler and Color Doppler Indications:    [facility]                 need disc  History:  Patient has prior history of Echocardiogram examinations, most                 recent 01/15/2021. Echo for [employer]  Sonographer:    Wenda Low Referring Phys: [id number] GRACE E BOWSER  Sonographer Comments: Echo performed with patient supine and on artificial respirator and patient is morbidly obese. IMPRESSIONS  1. Left ventricular ejection fraction, by estimation, is 65 to 70%. The left ventricle has normal function. The left ventricle has no  regional wall motion abnormalities. Left ventricular diastolic parameters were normal.  2. Right ventricular systolic function is normal. The right ventricular size is normal.  3. The mitral valve is normal in structure. Trivial mitral valve regurgitation.  4. The aortic valve is tricuspid. Aortic valve regurgitation is not visualized. No aortic stenosis is present. Comparison(s): Compared to prior echo on 01/15/21, the LVEF now appears normal (previously reported as 45-50%). FINDINGS  Left Ventricle: Left ventricular ejection fraction, by estimation, is 65 to 70%. The left ventricle has normal function. The left ventricle has no regional wall motion abnormalities. Definity contrast agent was given IV to delineate the left ventricular  endocardial borders. The left ventricular internal cavity size was normal in size. There is no left ventricular hypertrophy. Left ventricular diastolic parameters were normal. Right Ventricle: The right ventricular size is normal. No increase in right ventricular wall thickness. Right ventricular systolic function is normal. Left Atrium: Left atrial size was normal in size. Right Atrium: Right atrial size was normal in size. Pericardium: There is no evidence of pericardial effusion. Mitral Valve: The mitral valve is normal in structure. Trivial mitral valve regurgitation. MV peak gradient, 3.8 mmHg. The mean mitral valve gradient is 1.0 mmHg. Tricuspid Valve: The tricuspid valve is normal in structure. Tricuspid valve regurgitation is trivial. Aortic Valve: The aortic valve is tricuspid. Aortic valve regurgitation is not visualized. No aortic stenosis is present. Aortic valve mean gradient measures 5.0 mmHg. Aortic valve peak gradient measures 10.1 mmHg. Aortic valve area, by VTI measures 2.75  cm. Pulmonic Valve: The pulmonic valve was normal in structure. Pulmonic valve regurgitation is trivial. Aorta: The aortic root and ascending aorta are structurally normal, with no evidence of  dilitation. Venous: IVC assessment for right atrial pressure unable to be performed due to mechanical ventilation. IAS/Shunts: The atrial septum is grossly normal.  LEFT VENTRICLE PLAX 2D LVIDd:         5.30 cm     Diastology LVIDs:         3.00 cm     LV e' medial:    10.20 cm/s LV PW:         0.90 cm     LV E/e' medial:  8.5 LV IVS:        1.00 cm     LV e' lateral:   13.30 cm/s LVOT diam:     2.00 cm     LV E/e' lateral: 6.5 LV SV:         92 LV SV Index:   37 LVOT Area:     3.14 cm  LV Volumes (MOD) LV vol d, MOD A2C: 63.8 ml LV vol d, MOD A4C: 70.1 ml LV vol s, MOD A2C: 29.0 ml LV vol s, MOD A4C: 21.7 ml LV SV MOD A2C:     34.8 ml LV SV MOD A4C:     70.1 ml LV SV MOD BP:      42.8 ml RIGHT VENTRICLE RV Basal diam:  3.60 cm  RV Mid diam:    2.80 cm RV S prime:     8.81 cm/s TAPSE (M-mode): 2.6 cm LEFT ATRIUM             Index        RIGHT ATRIUM           Index LA diam:        4.00 cm 1.60 cm/m   RA Area:     13.60 cm LA Vol (A2C):   55.9 ml 22.33 ml/m  RA Volume:   32.10 ml  12.82 ml/m LA Vol (A4C):   60.8 ml 24.28 ml/m LA Biplane Vol: 59.4 ml 23.72 ml/m  AORTIC VALVE                    PULMONIC VALVE AV Area (Vmax):    2.51 cm     PV Vmax:       0.85 m/s AV Area (Vmean):   2.56 cm     PV Peak grad:  2.9 mmHg AV Area (VTI):     2.75 cm AV Vmax:           159.00 cm/s AV Vmean:          99.400 cm/s AV VTI:            0.335 m AV Peak Grad:      10.1 mmHg AV Mean Grad:      5.0 mmHg LVOT Vmax:         127.00 cm/s LVOT Vmean:        80.900 cm/s LVOT VTI:          0.293 m LVOT/AV VTI ratio: 0.87  AORTA Ao Root diam: 2.60 cm Ao Asc diam:  3.30 cm MITRAL VALVE               TRICUSPID VALVE MV Area (PHT): 4.19 cm    TR Peak grad:   24.6 mmHg MV Area VTI:   2.51 cm    TR Vmax:        248.00 cm/s MV Peak grad:  3.8 mmHg MV Mean grad:  1.0 mmHg    SHUNTS MV Vmax:       0.98 m/s    Systemic VTI:  0.29 m MV Vmean:      45.6 cm/s   Systemic Diam: 2.00 cm MV Decel Time: 181 msec MV E velocity: 86.90 cm/s MV A  velocity: 76.50 cm/s MV E/A ratio:  1.14 Gwyndolyn Kaufman MD Electronically signed by Gwyndolyn Kaufman MD Signature Date/Time: 01/18/2021/11:19:23 AM    Final    ECHOCARDIOGRAM COMPLETE  Result Date: 01/15/2021    ECHOCARDIOGRAM REPORT   Patient Name:   Xaivier Kouns Date of Exam: 01/15/2021 Medical Rec #:  [id number]         Height:       71.0 in Accession #:    [id number]        Weight:       280.9 lb Date of Birth:  19-Sep-1975         BSA:          2.436 m Patient Age:    65 years          BP:           104/75 mmHg Patient Gender: M                 HR:           76 bpm. Exam Location:  Inpatient Procedure: 2D Echo, Cardiac Doppler, Color Doppler and Intracardiac            Opacification Agent Indications:    Cardiac arrest I46.9  History:        Patient has no prior history of Echocardiogram examinations.                 Risk Factors:Hypertension. Polysubstance abuse. Renal disorder.  Sonographer:    Darlina Sicilian RDCS Referring Phys: [id number] Candee Furbish  Sonographer Comments: Echo performed with patient supine and on artificial respirator. IMPRESSIONS  1. LV not well seen even with definity Abnromal septal motion mild global hypokinesis . Left ventricular ejection fraction, by estimation, is 45 to 50%. The left ventricle has mildly decreased function. The left ventricle has no regional wall motion abnormalities. Left ventricular diastolic parameters were normal.  2. Right ventricular systolic function is normal. The right ventricular size is normal.  3. The mitral valve is normal in structure. Trivial mitral valve regurgitation. No evidence of mitral stenosis.  4. The aortic valve is tricuspid. Aortic valve regurgitation is not visualized. No aortic stenosis is present.  5. The inferior vena cava is normal in size with greater than 50% respiratory variability, suggesting right atrial pressure of 3 mmHg. FINDINGS  Left Ventricle: LV not well seen even with definity Abnromal septal motion mild global  hypokinesis. Left ventricular ejection fraction, by estimation, is 45 to 50%. The left ventricle has mildly decreased function. The left ventricle has no regional wall motion abnormalities. Definity contrast agent was given IV to delineate the left ventricular endocardial borders. The left ventricular internal cavity size was normal in size. There is no left ventricular hypertrophy. Left ventricular diastolic parameters were normal. Right Ventricle: The right ventricular size is normal. No increase in right ventricular wall thickness. Right ventricular systolic function is normal. Left Atrium: Left atrial size was normal in size. Right Atrium: Right atrial size was normal in size. Pericardium: There is no evidence of pericardial effusion. Mitral Valve: The mitral valve is normal in structure. Trivial mitral valve regurgitation. No evidence of mitral valve stenosis. Tricuspid Valve: The tricuspid valve is normal in structure. Tricuspid valve regurgitation is not demonstrated. No evidence of tricuspid stenosis. Aortic Valve: The aortic valve is tricuspid. Aortic valve regurgitation is not visualized. No aortic stenosis is present. Pulmonic Valve: The pulmonic valve was normal in structure. Pulmonic valve regurgitation is not visualized. No evidence of pulmonic stenosis. Aorta: The aortic root is normal in size and structure. Venous: The inferior vena cava is normal in size with greater than 50% respiratory variability, suggesting right atrial pressure of 3 mmHg. IAS/Shunts: No atrial level shunt detected by color flow Doppler.  LEFT VENTRICLE PLAX 2D LVIDd:         5.30 cm     Diastology LVIDs:         3.60 cm     LV e' medial:    6.49 cm/s LV PW:         0.80 cm     LV E/e' medial:  6.7 LV IVS:        0.80 cm     LV e' lateral:   6.20 cm/s LVOT diam:     2.00 cm     LV E/e' lateral: 7.0 LV SV:         39 LV SV Index:   16 LVOT Area:     3.14 cm  LV Volumes (MOD) LV vol d, MOD A2C: 79.5  ml LV vol d, MOD A4C: 74.1 ml  LV vol s, MOD A2C: 49.3 ml LV vol s, MOD A4C: 41.1 ml LV SV MOD A2C:     30.2 ml LV SV MOD A4C:     74.1 ml LV SV MOD BP:      30.4 ml RIGHT VENTRICLE RV S prime:     7.95 cm/s TAPSE (M-mode): 2.0 cm LEFT ATRIUM             Index        RIGHT ATRIUM          Index LA diam:        2.80 cm 1.15 cm/m   RA Area:     9.43 cm LA Vol (A2C):   25.8 ml 10.59 ml/m  RA Volume:   17.90 ml 7.35 ml/m LA Vol (A4C):   19.1 ml 7.84 ml/m LA Biplane Vol: 22.4 ml 9.20 ml/m  AORTIC VALVE LVOT Vmax:   71.10 cm/s LVOT Vmean:  50.800 cm/s LVOT VTI:    0.123 m  AORTA Ao Root diam: 3.30 cm Ao Asc diam:  3.10 cm MITRAL VALVE MV Area (PHT): 2.95 cm    SHUNTS MV Decel Time: 257 msec    Systemic VTI:  0.12 m MV E velocity: 43.40 cm/s  Systemic Diam: 2.00 cm MV A velocity: 45.60 cm/s MV E/A ratio:  0.95 Jenkins Rouge MD Electronically signed by Jenkins Rouge MD Signature Date/Time: 01/15/2021/10:05:03 AM    Final    VAS US CAROTID  Result Date: 01/15/2021 Carotid Arterial Duplex Study Patient Name:  DONTAVIAN MARCHI  Date of Exam:   01/15/2021 Medical Rec #: [id number]          Accession #:    [id number] Date of Birth: 1975-02-25          Patient Gender: M Patient Age:   62 years Exam Location:  [facility] Procedure:      VAS US CAROTID Referring Phys: Ina Homes --------------------------------------------------------------------------------  Indications:       Left dissection. Risk Factors:      Hypertension. Limitations        Today's exam was limited due to patient on a ventilator and                    patient positioning. Comparison Study:  no prior Performing Technologist: Archie Patten RVS  Examination Guidelines: A complete evaluation includes B-mode imaging, spectral Doppler, color Doppler, and power Doppler as needed of all accessible portions of each vessel. Bilateral testing is considered an integral part of a complete examination. Limited examinations for reoccurring indications may be performed as noted.  Right  Carotid Findings: +----------+--------+--------+--------+------------------+--------+             PSV cm/s EDV cm/s Stenosis Plaque Description Comments  +----------+--------+--------+--------+------------------+--------+  CCA Prox   54       14                heterogenous                 +----------+--------+--------+--------+------------------+--------+  CCA Distal 36       14                heterogenous                 +----------+--------+--------+--------+------------------+--------+  ICA Prox   44       19       1-39%    heterogenous                 +----------+--------+--------+--------+------------------+--------+  ICA Distal 81       30                                             +----------+--------+--------+--------+------------------+--------+  ECA        55       10                                             +----------+--------+--------+--------+------------------+--------+ +----------+--------+-------+--------+-------------------+             PSV cm/s EDV cms Describe Arm Pressure (mmHG)  +----------+--------+-------+--------+-------------------+  Subclavian 48                                             +----------+--------+-------+--------+-------------------+ +---------+--------+--+--------+--+---------+  Vertebral PSV cm/s 47 EDV cm/s 16 Antegrade  +---------+--------+--+--------+--+---------+  Left Carotid Findings: +----------+--------+--------+--------+------------------+--------+             PSV cm/s EDV cm/s Stenosis Plaque Description Comments  +----------+--------+--------+--------+------------------+--------+  CCA Prox   52       9                 heterogenous                 +----------+--------+--------+--------+------------------+--------+  CCA Distal 29       10                heterogenous                 +----------+--------+--------+--------+------------------+--------+  ICA Prox   27       11       1-39%    heterogenous                  +----------+--------+--------+--------+------------------+--------+  ICA Distal 70       22                                             +----------+--------+--------+--------+------------------+--------+  ECA        49       9                                              +----------+--------+--------+--------+------------------+--------+ +----------+--------+--------+--------+-------------------+             PSV cm/s EDV cm/s Describe Arm Pressure (mmHG)  +----------+--------+--------+--------+-------------------+  Subclavian 55                                              +----------+--------+--------+--------+-------------------+ +---------+--------+--+--------+--+---------+  Vertebral PSV cm/s 37 EDV cm/s 14 Antegrade  +---------+--------+--+--------+--+---------+   Summary: Right Carotid: Velocities in the right ICA are consistent with a 1-39% stenosis. Left Carotid: Velocities in the left ICA are consistent with a 1-39% stenosis.  No evidence of dissection in the left cca/ ica.  *See table(s) above for measurements and observations.  Electronically signed by Monica Martinez MD on 01/15/2021 at 11:34:14 AM.    Final    VAS [place] LOWER EXTREMITY VENOUS (DVT)  Result Date: 01/15/2021  Lower Venous DVT Study Patient Name:  STAN CANTAVE  Date of Exam:   01/15/2021 Medical Rec #: [id number]          Accession #:    [id number] Date of Birth: 18-May-1975          Patient Gender: M Patient Age:   66 years Exam Location:  [facility] Procedure:      VAS [place] LOWER EXTREMITY VENOUS (DVT) Referring Phys: Ina Homes --------------------------------------------------------------------------------  Indications: Edema.  Comparison Study: no prior Performing Technologist: Archie Patten RVS  Examination Guidelines: A complete evaluation includes B-mode imaging, spectral Doppler, color Doppler, and power Doppler as needed of all accessible portions of each vessel. Bilateral testing is considered an integral  part of a complete examination. Limited examinations for reoccurring indications may be performed as noted. The reflux portion of the exam is performed with the patient in reverse Trendelenburg.  +---------+---------------+---------+-----------+----------+--------------+  RIGHT     Compressibility Phasicity Spontaneity Properties Thrombus Aging  +---------+---------------+---------+-----------+----------+--------------+  CFV       Full            Yes       Yes                                    +---------+---------------+---------+-----------+----------+--------------+  SFJ       Full                                                             +---------+---------------+---------+-----------+----------+--------------+  FV Prox   Full                                                             +---------+---------------+---------+-----------+----------+--------------+  FV Mid    Full                                                             +---------+---------------+---------+-----------+----------+--------------+  FV Distal Full                                                             +---------+---------------+---------+-----------+----------+--------------+  PFV       Full                                                             +---------+---------------+---------+-----------+----------+--------------+  POP       Full            Yes       Yes                                    +---------+---------------+---------+-----------+----------+--------------+  PTV       Full                                                             +---------+---------------+---------+-----------+----------+--------------+  PERO      Full                                                             +---------+---------------+---------+-----------+----------+--------------+   +---------+---------------+---------+-----------+----------+--------------+  LEFT      Compressibility Phasicity Spontaneity Properties Thrombus Aging   +---------+---------------+---------+-----------+----------+--------------+  CFV       Full            Yes       Yes                                    +---------+---------------+---------+-----------+----------+--------------+  SFJ       Full                                                             +---------+---------------+---------+-----------+----------+--------------+  FV Prox   Full                                                             +---------+---------------+---------+-----------+----------+--------------+  FV Mid    Full                                                             +---------+---------------+---------+-----------+----------+--------------+  FV Distal Full                                                             +---------+---------------+---------+-----------+----------+--------------+  PFV       Full                                                             +---------+---------------+---------+-----------+----------+--------------+  POP       Full            Yes       Yes                                    +---------+---------------+---------+-----------+----------+--------------+  PTV       Full                                                             +---------+---------------+---------+-----------+----------+--------------+  PERO      Full                                                             +---------+---------------+---------+-----------+----------+--------------+     Summary: BILATERAL: - No evidence of deep vein thrombosis seen in the lower extremities, bilaterally. -No evidence of popliteal cyst, bilaterally.   *See table(s) above for measurements and observations. Electronically signed by Monica Martinez MD on 01/15/2021 at 11:34:37 AM.    Final    CT CHEST ABDOMEN PELVIS WO CONTRAST  Result Date: 01/18/2021 CLINICAL DATA:  Sepsis. 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 Post cardiac arrest. EXAM: CT CHEST, ABDOMEN AND PELVIS WITHOUT CONTRAST TECHNIQUE:  Multidetector CT imaging of the chest, abdomen and pelvis was performed following the standard protocol without IV contrast. COMPARISON:  Chest CT 01/15/2021 FINDINGS: CT CHEST FINDINGS Cardiovascular: The thoracic aorta is normal in caliber. Conventional branching pattern from the aortic arch. Normal caliber main pulmonary artery. The heart is normal in size. No pericardial effusion. Mediastinum/Nodes: No enlarged mediastinal lymph nodes. There is no bulky hilar adenopathy on this unenhanced exam. Endotracheal tube tip above the carina. Enteric tube decompresses the esophagus. No visualized thyroid nodule. Lungs/Pleura: Small to moderate bilateral pleural effusions. Adjacent compressive atelectasis. Additional patchy ground-glass and nodular opacities in the upper lobes and lingula. Overall improving lung aeration from recent chest CT. Improved interlobular septal thickening. Trachea and central bronchi are patent. Musculoskeletal: Minimal thoracic spondylosis with endplate spurring. There are no acute or suspicious osseous abnormalities. No definite chest wall soft tissue abnormalities. CT ABDOMEN PELVIS FINDINGS Hepatobiliary: Borderline decreased hepatic density may represent mild steatosis. No focal liver abnormality is seen. Small calcified gallstone. No pericholecystic fat stranding or inflammation. No biliary dilatation. Pancreas: No pancreatic mass.  No ductal dilatation or inflammation. Spleen: Mild splenomegaly with spleen measuring 14.3 cm cranial caudal dimension. No focal abnormality. Adrenals/Urinary Tract: Normal adrenal glands. Both kidneys are enlarged and multi cystic in appearance. Greater than 10 cysts within both kidneys, with cystic replacement of the renal parenchyma. There is a 6 mm stone in the left proximal ureter just beyond the ureteropelvic junction, with extrarenal pelvis configuration versus mild hydronephrosis. 9 mm calcification in the lower left kidney may be a parenchymal  calcification or nonobstructing stone. Mild left greater than right perinephric edema. Punctate nonobstructing stone in the mid right kidney. No evidence of solid renal lesion. Urinary bladder is physiologically distended. Air-fluid level is likely related to Foley catheter placement. Foley catheter balloon is blown up in the  prostatic urethra. Stomach/Bowel: Enteric tube in the stomach. No gastric wall thickening. No small bowel obstruction or inflammation. Normal appendix. Occasional liquid stool throughout the colon without colonic wall thickening or pericolonic edema. Mild left colonic diverticulosis without diverticulitis. Vascular/Lymphatic: Right femoral catheter tip in the external iliac vein. Mild aortic and iliac atherosclerosis. No aortic aneurysm. No portal venous or mesenteric gas. No bulky abdominopelvic adenopathy. Reproductive: Foley catheter balloon blown up in the prostatic urethra. Prostate otherwise unremarkable. Other: No free air or ascites. Musculoskeletal: Degenerative change in the lumbar spine, most prominent degenerative disc disease at L3-L4. No focal bone lesion. IMPRESSION: 1. Small to moderate bilateral pleural effusions with adjacent compressive atelectasis. Additional patchy ground-glass and nodular opacities in the upper lobes and lingula, suspicious for pneumonia or aspiration. Overall improving lung aeration from recent chest CT. 2. Enlarged multi cystic kidneys consistent with polycystic kidney disease. 3. A 6 mm stone in the left proximal ureter just beyond the ureteropelvic junction with mild left hydronephrosis versus extrarenal pelvis, stone was present on 2017 exam and appears chronic. Additional 9 mm calcification in the lower left kidney may be a parenchymal calcification or nonobstructing stone. 4. Foley catheter balloon blown up in the prostatic urethra. Recommend repositioning. 5. Mild splenomegaly. 6. Cholelithiasis without gallbladder inflammation. 7. Mild left  colonic diverticulosis without diverticulitis. Aortic Atherosclerosis (ICD10-I70.0). Electronically Signed   By: Keith Rake M.D.   On: 01/18/2021 01:10    Microbiology Recent Results (from the past 240 hour(s))  Resp Panel by RT-PCR (Flu A&B, Covid) Nasopharyngeal Swab     Status: None   Collection Time: 01/03/2021  2:55 PM   Specimen: Nasopharyngeal Swab; Nasopharyngeal(NP) swabs in vial transport medium  Result Value Ref Range Status   SARS Coronavirus 2 by RT PCR NEGATIVE NEGATIVE Final    Comment: (NOTE) SARS-CoV-2 target nucleic acids are NOT DETECTED.  The SARS-CoV-2 RNA is generally detectable in upper respiratory specimens during the acute phase of infection. The lowest concentration of SARS-CoV-2 viral copies this assay can detect is 138 copies/mL. A negative result does not preclude SARS-Cov-2 infection and should not be used as the sole basis for treatment or other patient management decisions. A negative result may occur with  improper specimen collection/handling, submission of specimen other than nasopharyngeal swab, presence of viral mutation(s) within the areas targeted by this assay, and inadequate number of viral copies(<138 copies/mL). A negative result must be combined with clinical observations, patient history, and epidemiological information. The expected result is Negative.  Fact Sheet for Patients:  EntrepreneurPulse.com.au  Fact Sheet for Healthcare Providers:  IncredibleEmployment.be  This test is no t yet approved or cleared by the [place] FDA and  has been authorized for detection and/or diagnosis of SARS-CoV-2 by FDA under an Emergency Use Authorization (EUA). This EUA will remain  in effect (meaning this test can be used) for the duration of the COVID-19 declaration under Section 564(b)(1) of the Act, 21 U.S.C.section 360bbb-3(b)(1), unless the authorization is terminated  or revoked sooner.        Influenza A by PCR NEGATIVE NEGATIVE Final   Influenza B by PCR NEGATIVE NEGATIVE Final    Comment: (NOTE) The Xpert Xpress SARS-CoV-2/FLU/RSV plus assay is intended as an aid in the diagnosis of influenza from Nasopharyngeal swab specimens and should not be used as a sole basis for treatment. Nasal washings and aspirates are unacceptable for Xpert Xpress SARS-CoV-2/FLU/RSV testing.  Fact Sheet for Patients: EntrepreneurPulse.com.au  Fact Sheet for Healthcare Providers: IncredibleEmployment.be  This  test is not yet approved or cleared by the [place] and has been authorized for detection and/or diagnosis of SARS-CoV-2 by FDA under an Emergency Use Authorization (EUA). This EUA will remain in effect (meaning this test can be used) for the duration of the COVID-19 declaration under Section 564(b)(1) of the Act, 21 U.S.C. section 360bbb-3(b)(1), unless the authorization is terminated or revoked.  Performed at [facility], [place] [address], [place], [place] [address]   Blood Culture (routine x 2)     Status: None   Collection Time: 01/01/2021  5:48 PM   Specimen: BLOOD  Result Value Ref Range Status   Specimen Description BLOOD LEFT ANTECUBITAL  Final   Special Requests   Final    BOTTLES DRAWN AEROBIC ONLY Blood Culture adequate volume   Culture   Final    NO GROWTH 5 DAYS Performed at [facility], [place] [address], [place], [place] [address]    Report Status 01/28/21 FINAL  Final  Blood Culture (routine x 2)     Status: None   Collection Time: 01/03/2021  5:55 PM   Specimen: BLOOD LEFT ARM  Result Value Ref Range Status   Specimen Description BLOOD LEFT ARM  Final   Special Requests   Final    BOTTLES DRAWN AEROBIC ONLY Blood Culture adequate volume   Culture   Final    NO GROWTH 5 DAYS Performed at [facility], [place] [address] [place], [place] [address]    Report Status 01-28-21 FINAL  Final  MRSA Next Gen  by PCR, Nasal     Status: None   Collection Time: 12/30/2020  9:52 PM   Specimen: Nasal Mucosa; Nasal Swab  Result Value Ref Range Status   MRSA by PCR Next Gen NOT DETECTED NOT DETECTED Final    Comment: (NOTE) The GeneXpert MRSA Assay (FDA approved for NASAL specimens only), is one component of a comprehensive MRSA colonization surveillance program. It is not intended to diagnose MRSA infection nor to guide or monitor treatment for MRSA infections. Test performance is not FDA approved in patients less than 48 years old. Performed at [facility], [place] [address] [place], [place] [address]   Culture, Respiratory w Gram Stain     Status: None   Collection Time: 01/15/21  6:28 PM   Specimen: Bronchoalveolar Lavage; Respiratory  Result Value Ref Range Status   Specimen Description BRONCHIAL ALVEOLAR LAVAGE  Final   Special Requests NONE  Final   Gram Stain   Final    MODERATE WBC PRESENT, PREDOMINANTLY MONONUCLEAR FEW GRAM POSITIVE COCCI Performed at [facility], [place] [address] [place], [place] [address]    Culture FEW STAPHYLOCOCCUS AUREUS  Final   Report Status 01/18/2021 FINAL  Final   Organism ID, Bacteria STAPHYLOCOCCUS AUREUS  Final      Susceptibility   Staphylococcus aureus - MIC*    CIPROFLOXACIN 2 INTERMEDIATE Intermediate     ERYTHROMYCIN <=0.25 SENSITIVE Sensitive     GENTAMICIN <=0.5 SENSITIVE Sensitive     OXACILLIN <=0.25 SENSITIVE Sensitive     TETRACYCLINE <=1 SENSITIVE Sensitive     VANCOMYCIN 1 SENSITIVE Sensitive     TRIMETH/SULFA <=10 SENSITIVE Sensitive     CLINDAMYCIN <=0.25 SENSITIVE Sensitive     RIFAMPIN <=0.5 SENSITIVE Sensitive     Inducible Clindamycin NEGATIVE Sensitive     * FEW STAPHYLOCOCCUS AUREUS  Culture, blood (routine x 2)     Status: None  Collection Time: 01/17/21  5:11 PM   Specimen: BLOOD  Result Value Ref Range Status   Specimen Description BLOOD LEFT ANTECUBITAL  Final   Special Requests   Final    BOTTLES DRAWN  AEROBIC AND ANAEROBIC Blood Culture adequate volume   Culture   Final    NO GROWTH 5 DAYS Performed at [facility], [place] [address], [place], [place] [address]    Report Status 01/22/2021 FINAL  Final  Culture, blood (routine x 2)     Status: None   Collection Time: 01/17/21  5:25 PM   Specimen: BLOOD  Result Value Ref Range Status   Specimen Description BLOOD LEFT ANTECUBITAL  Final   Special Requests   Final    BOTTLES DRAWN AEROBIC ONLY Blood Culture adequate volume   Culture   Final    NO GROWTH 5 DAYS Performed at [facility], [place] [address], [place], [place] [address]    Report Status 01/22/2021 FINAL  Final  Urine Culture     Status: None   Collection Time: 01/17/21  5:45 PM   Specimen: Urine, Catheterized  Result Value Ref Range Status   Specimen Description URINE, CATHETERIZED  Final   Special Requests NONE  Final   Culture   Final    NO GROWTH Performed at [facility], [place] [address] [place], [place] [address]    Report Status 01/18/2021 FINAL  Final  Culture, BAL-quantitative w Gram Stain     Status: Abnormal   Collection Time: 01/17/21  6:19 PM   Specimen: Bronchoalveolar Lavage; Respiratory  Result Value Ref Range Status   Specimen Description BRONCHIAL ALVEOLAR LAVAGE  Final   Special Requests NONE  Final   Gram Stain   Final    NO SQUAMOUS EPITHELIAL CELLS SEEN NO WBC SEEN NO ORGANISMS SEEN Performed at [facility], [address] [place] [address] [place], [place] [address]    Culture 50,000 COLONIES/mL STAPHYLOCOCCUS AUREUS (A)  Final   Report Status 01/20/2021 FINAL  Final   Organism ID, Bacteria STAPHYLOCOCCUS AUREUS (A)  Final      Susceptibility   Staphylococcus aureus - MIC*    CIPROFLOXACIN 1 SENSITIVE Sensitive     ERYTHROMYCIN <=0.25 SENSITIVE Sensitive     GENTAMICIN <=0.5 SENSITIVE Sensitive     OXACILLIN <=0.25 SENSITIVE Sensitive     TETRACYCLINE <=1 SENSITIVE Sensitive     VANCOMYCIN 1 SENSITIVE Sensitive     TRIMETH/SULFA  <=10 SENSITIVE Sensitive     CLINDAMYCIN <=0.25 SENSITIVE Sensitive     RIFAMPIN <=0.5 SENSITIVE Sensitive     Inducible Clindamycin NEGATIVE Sensitive     * 50,000 COLONIES/mL STAPHYLOCOCCUS AUREUS    Lab Basic Metabolic Panel: Recent Labs  Lab 01/17/21 0149 01/17/21 0230 01/17/21 0430 01/17/21 0809 01/17/21 1745 01/17/21 2145 01/18/21 0751 01/18/21 1117 01/18/21 1420 01/18/21 1651 01/18/21 1757 01/18/21 2132 02-05-21 0506  NA 155*   < > 151*   < > 156*   < > 156* 162* 158* 162* 159* 163*  --   K 3.7  --  3.8  --  3.8   < > 3.5 4.1 3.7 3.8 3.6 3.8  --   CL 129*  --  122*  --  127*  --  >130*  --  >130*  --  >130*  --   --   CO2 21*  --  22  --  22  --  21*  --  21*  --  23  --   --  GLUCOSE 101*  --  126*  --  109*  --  111*  --  120*  --  118*  --   --   BUN 51*  --  37*  --  47*  --  53*  --  53*  --  54*  --   --   CREATININE 1.97*  --  2.02*  --  2.14*  --  1.57*  --  1.55*  --  1.55*  --   --   CALCIUM 7.8*  --  7.7*  --  8.0*  --  8.2*  --  8.3*  --  8.3*  --   --   MG 2.5*  --  2.5*  --   --   --   --   --   --   --   --   --  2.3  PHOS 4.4  --  2.9  --   --   --   --   --   --   --   --   --  4.4   < > = values in this interval not displayed.   Liver Function Tests: Recent Labs  Lab 01/17/21 0149 01/17/21 1745 01/18/21 0751 01/18/21 1757  AST 14* 16 14* 15  ALT _0 ALKPHOS 46 42 49 48  BILITOT 0.7 0.7 0.4 0.5  PROT 4.9* 5.1* 5.4* 5.3*  ALBUMIN 1.9* 1.9* 1.9* 1.8*   Recent Labs  Lab 01/17/21 0149 2021-02-12 0506  LIPASE 35 33  AMYLASE 89 69   No results for input(s): AMMONIA in the last 168 hours. CBC: Recent Labs  Lab 01/17/21 0149 01/17/21 0430 01/17/21 2326 01/18/21 1117 01/18/21 1420 01/18/21 1651 01/18/21 2132 02/12/2021 0506  WBC 10.8* 13.5*  --   --  11.3*  --   --  9.1  NEUTROABS 8.9*  --   --   --   --   --   --   --   HGB 10.3* 10.9*   < > 9.9* 11.0* 10.2* 9.9* 11.3*  HCT 31.8* 32.5*   < > 29.0* 33.3* 30.0* 29.0* 34.5*   MCV 94.1 91.5  --   --  93.0  --   --  93.2  PLT 122* 126*  --   --  143*  --   --  151   < > = values in this interval not displayed.   Cardiac Enzymes: Recent Labs  Lab 01/17/21 0149 02-12-2021 0506  CKTOTAL 29* 27*  CKMB 4.6 1.8   Sepsis Labs: Recent Labs  Lab 01/17/21 0149 01/17/21 0430 01/18/21 1420 12-Feb-2021 0506  WBC 10.8* 13.5* 11.3* 9.1      Amandalee Lacap Rodman Pickle 01/23/2021, 10:06 PM

## 2021-02-15 NOTE — Progress Notes (Signed)
Daily Progress Note   Patient Name: Brent Horn       Date: 01/24/2021 DOB: [birth year]  Age: 46 y.o. MRN#: [id number] Attending Physician: Luciano Cutter, MD Primary Care Physician: Patient, No Pcp Per (Inactive) Admit Date: 01/07/2021  Reason for Consultation/Follow-up: Establishing goals of care  Patient Profile/HPI: 46 y.o. male  with past medical history of Arthritis, Chronic back pain, Hypertension, Kidney stone, Polycystic kidney disease, Renal disorder, cocaine abuse admitted on 12/30/2020 with out of hospital cardiac arrest preceded by hemoptysis.  When he arrived in the ER he was awake with ROSC but due to worsening respiratory status he was intubated and PCCM was consulted.   CT scan on 1/1 showed left MCA stroke with midline shift, hypertonic saline was DC'd and recommend goals of care conversation.  Neurology is on board.   PMT was consulted for goals of care conversations.   Decision made 1/3 to transition patient to comfort tentatively on 1/5.  Subjective: Patient is not an organ donor candidate. Large amount of family are gathered for honor moment prior to d/c of life prolonging interventions.   Review of Systems  Unable to perform ROS: Acuity of condition    Physical Exam Vitals and nursing note reviewed.  Pulmonary:     Comments: Intubated, full vent support           Vital Signs: BP (!) 113/52    Pulse (!) 56    Temp 99.9 F (37.7 C)    Resp (!) 28    Ht 5\' 11"  (1.803 m)    Wt (!) 136.7 kg    SpO2 92%    BMI 42.03 kg/m  SpO2: SpO2: 92 % O2 Device: O2 Device: Ventilator O2 Flow Rate:    Intake/output summary:  Intake/Output Summary (Last 24 hours) at Jan 24, 2021 1314 Last data filed at January 24, 2021 1300 Gross per 24 hour  Intake 2699.87 ml  Output 3465 ml   Net -765.13 ml   LBM: Last BM Date: 01/18/21 Baseline Weight: Weight: 129.6 kg Most recent weight: Weight: (!) 136.7 kg       Palliative Assessment/Data: PPS: 10%      Patient Active Problem List   Diagnosis Date Noted   Anoxic brain injury (HCC)    Cerebral embolism with cerebral infarction 01/16/2021   Palliative care by specialist  Goals of care, counseling/discussion    ARDS (adult respiratory distress syndrome) (HCC)    Acute respiratory failure with hypoxia (HCC)    Cocaine abuse (HCC)    Hemoptysis    Cardiac arrest (HCC) 2021/02/01    Palliative Care Assessment & Plan    Assessment/Recommendations/Plan  De-escalation of care and withdrawal of life sustaining interventions planned for today Comfort orders entered- recommend continuing IV propofol at current rate- if needed can increase, also use bolus doses of fentanyl for any signs of air hunger or discomfort D/C labs, IV fluids, pressors   Code Status: DNR  Prognosis:  Hours - Days  Discharge Planning: Anticipated Hospital Death  Care plan was discussed with patient's nurse.  Thank you for allowing the Palliative Medicine Team to assist in the care of this patient.  Greater than 50%  of this time was spent counseling and coordinating care related to the above assessment and plan.  Ocie Bob, AGNP-C Palliative Medicine   Please contact Palliative Medicine Team phone at [phone] for questions and concerns.

## 2021-02-15 NOTE — Procedures (Signed)
Extubation Procedure Note  Patient Details:   Name: Brent Horn DOB: 07-Dec-1975 MRN: [id number]   Airway Documentation:    Vent end date: 01/22/2021 Vent end time: 1314   Evaluation Pt withdrawal from ventilator per comfort care orders. Guss Bunde 01/24/2021, 1:15 PM

## 2021-02-15 NOTE — Progress Notes (Signed)
Responded to page to continue support to family.  Family requested prayer again and needed additional support. Prayer and spiritual support was provided the patient passed.  Chaplain will follow as needed.  Venida Jarvis, [place], [facility], Pager [phone]

## 2021-02-15 NOTE — Progress Notes (Signed)
I spoke with patient's wife Merry Proud who had questions about pt's presenting diagnosis and hospital course.  Reviewed CT results and explained anoxic brain injury and stroke.  Brandi expressed understanding and had no further questions.  Darcella Gasman Ameenah Prosser, PA-C

## 2021-02-15 NOTE — Progress Notes (Signed)
NAME:  Brent Horn, MRN:  [id number], DOB:  26-Aug-1975, LOS: 5 ADMISSION DATE:  Feb 02, 2021, CONSULTATION DATE:  February 02, 2021 REFERRING MD:  Lynelle Doctor, CHIEF COMPLAINT:  hemoptysis   History of Present Illness:  46 y.o. male  with past medical history of Arthritis, Chronic back pain, Hypertension, Kidney stone, Polycystic kidney disease, Renal disorder, cocaine abuse admitted on February 02, 2021 with out of hospital cardiac arrest preceded by hemoptysis.  When he arrived in the ER he was awake with ROSC but due to worsening respiratory status he was intubated and PCCM was consulted.  Pertinent  Medical History  HTN PCKD  Significant Hospital Events: Including procedures, antibiotic start and stop dates in addition to other pertinent events   12/31 admission 1/1 CT showing L MCA stroke with midline shift, hypertonic saline Dc'd, GOC discussion and Palliative Care consulted Bronch 01/15/2021 for large amount of bloody secretions 01/16/2021 Made DNR with no escalation of care after family meeting, plan if for additional family meeting 01/17/2021 at 12 noon for further discussion  1/4 Family meeting yesterday, plan for compassionate extubation 1/5  Interim History / Subjective:  Per report pt is no longer an organ donor candidate per [facility], may be tissue donor Plan to continue with compassionate extubation and comfort care today  Objective   Blood pressure 111/63, pulse (!) 50, temperature 99.3 F (37.4 C), resp. rate (!) 28, height 5\' 11"  (1.803 m), weight (!) 136.7 kg, SpO2 93 %.    Vent Mode: PRVC FiO2 (%):  [50 %-100 %] 50 % Set Rate:  [28 bmp] 28 bmp Vt Set:  [600 mL] 600 mL PEEP:  [5 cmH20] 5 cmH20 Plateau Pressure:  [22 cmH20-24 cmH20] 22 cmH20   Intake/Output Summary (Last 24 hours) at 01/28/2021 0934 Last data filed at 01/18/2021 0800 Gross per 24 hour  Intake 2941.44 ml  Output 5300 ml  Net -2358.56 ml    Filed Weights   01/15/21 0445 01/17/21 0600 01/18/21 0401  Weight: 127.4  kg 135.9 kg (!) 136.7 kg   General:  critically ill-appearing M, intubated and sedated HEENT: MM pink/moist, pupils fixed Neuro: unresponsive CV: s1s2 rrr, no m/r/g PULM:  mechanical breath sounds bilaterally, synchronous with vent GI: soft, bsx4 active  Extremities: warm/dry, 2+ edema  Skin: no rashes or lesions    Resolved Hospital Problem list   N/a  Assessment & Plan:   OOH prolonged cardiac arrest presumed due to the aspiration event with ROSC Global anoxic injury Cocaine positive  some report that he was groggy and able to answer questions per EDP but quickly decompensated. Plan  Compassionate extubation today, continue supportive care until family and friends arrive Wean pressors for MAP > 65 mm Hg Telemetry Normothermia Maintain Mag > 2  and K > 4  ARDS secondary to massive hemoptysis in setting of cocaine abuse Stomach was clean with NGT on admission so nasal source unlikely, suspect this was acute alveolitis from cocaine inhalation; no evidence of ongoing alveolar bleeding on serial lavage 01/15/2021 Plan -continue full supportive care until family arrives as above, propofol and fentanyl for comfort    Bilateral CVAs- complete L MCA, scattered R cerebral infarcts, cocaine effect vs. Embolic phenomenon vs. Effects from prolonged severe hypoxemia and shock Cytotoxic edema/L>R midline shift-  from L MCA stroke, on 3% saline as salvage Plan  -comfort care    Heavy smoker, heavy drinker Acute kidney injury -transition to comfort care   Best Practice (right click and "Reselect all SmartList Selections" daily)  Diet/type: NPO DVT prophylaxis: held GI prophylaxis: PPI Lines: yes and it is still needed Foley:  Yes, and it is still needed Code Status:  DNR Last date of multidisciplinary goals of care discussion [01/18/21, transition to comfort care 1/5]  CRITICAL CARE Performed by: Darcella Gasman Sheniqua Carolan   Total critical care time: 32 minutes  Critical care time  was exclusive of separately billable procedures and treating other patients.  Critical care was necessary to treat or prevent imminent or life-threatening deterioration.  Critical care was time spent personally by me on the following activities: development of treatment plan with patient and/or surrogate as well as nursing, discussions with consultants, evaluation of patient's response to treatment, examination of patient, obtaining history from patient or surrogate, ordering and performing treatments and interventions, ordering and review of laboratory studies, ordering and review of radiographic studies, pulse oximetry and re-evaluation of patient's condition.   Darcella Gasman Bethanny Toelle, PA-C [facility] See Amion for pager If no response to pager , please call [phone] until 7pm After 7:00 pm call Elink  [phone]

## 2021-02-15 NOTE — Progress Notes (Signed)
This chaplain is present for EOL spiritual care and compassionate extubation.   The chaplain offered a spiritual care presence for family and friends in coordination with [employer]Mary Jae Dire and the Pt. RN-Courtenay.  The chaplain shared prayer with the community during the [employer] and at the Pt. bedside.  The chaplain understands the RN will facilitate remembrance hand prints.  This chaplain is available for F/U spiritual care as needed.  Chaplain Stephanie Acre [phone]

## 2021-02-15 DEATH — deceased
